# Patient Record
Sex: Female | Born: 1980
Health system: Southern US, Community
[De-identification: ages and names within clinical notes are randomized; demographics above are authoritative.]

## PROBLEM LIST (undated history)

## (undated) DIAGNOSIS — F419 Anxiety disorder, unspecified: Secondary | ICD-10-CM

## (undated) DIAGNOSIS — F32A Depression, unspecified: Secondary | ICD-10-CM

## (undated) DIAGNOSIS — D510 Vitamin B12 deficiency anemia due to intrinsic factor deficiency: Secondary | ICD-10-CM

## (undated) DIAGNOSIS — E785 Hyperlipidemia, unspecified: Secondary | ICD-10-CM

## (undated) HISTORY — DX: Anxiety disorder, unspecified: F41.9

## (undated) HISTORY — DX: Vitamin B12 deficiency anemia due to intrinsic factor deficiency: D51.0

## (undated) HISTORY — PX: KNEE SURGERY: SHX244

## (undated) HISTORY — DX: Depression, unspecified: F32.A

## (undated) HISTORY — DX: Hyperlipidemia, unspecified: E78.5

## (undated) HISTORY — PX: TONSILLECTOMY: SUR1361

## (undated) HISTORY — PX: TYMPANOSTOMY TUBE PLACEMENT: SHX32

## (undated) HISTORY — PX: APPENDECTOMY: SHX54

---

## 2006-10-02 ENCOUNTER — Ambulatory Visit (HOSPITAL_COMMUNITY): Admission: RE | Admit: 2006-10-02 | Discharge: 2006-10-02 | Payer: Self-pay | Admitting: Family Medicine

## 2006-10-02 IMAGING — US US OB TRANSVAGINAL MODIFY
1 series · 14 of 16 positions shown · non-contrast
Comparison: none

OBSTETRICAL ULTRASOUND:

 This ultrasound exam was performed in the [HOSPITAL] Ultrasound Department.  The OB US report was generated in the AS system, and faxed to the ordering physician.  This report is also available in [REDACTED] PACS.

[Series 1: us ob transvaginal modify · 0.30mm/px · 14 of 16 slices shown]
[im 1/16]
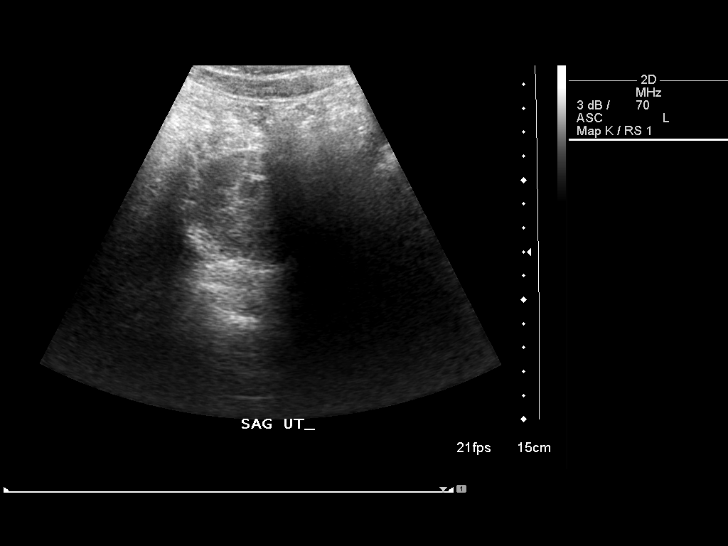
[im 2/16]
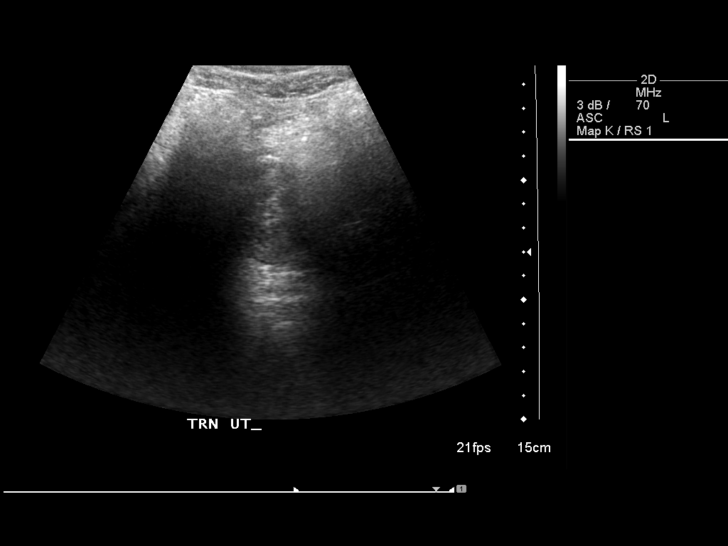
[im 3/16]
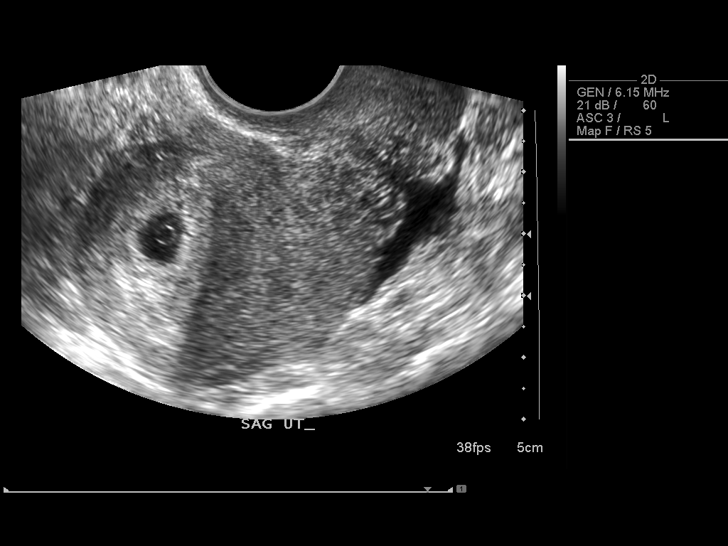
[im 5/16]
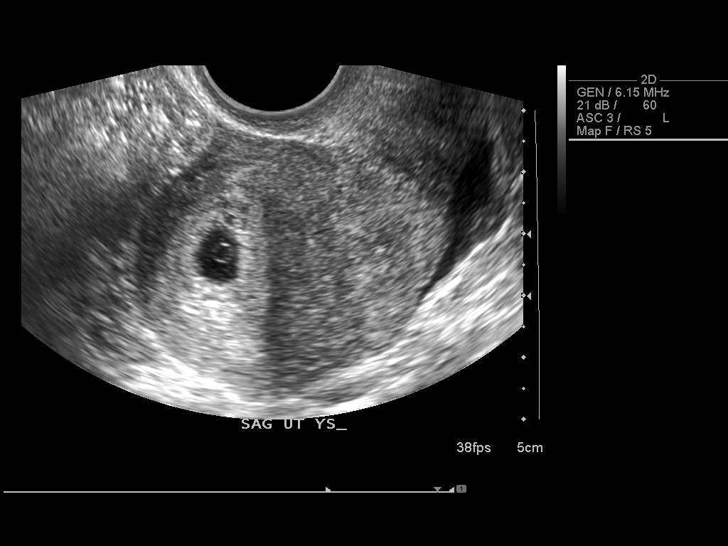
[im 6/16]
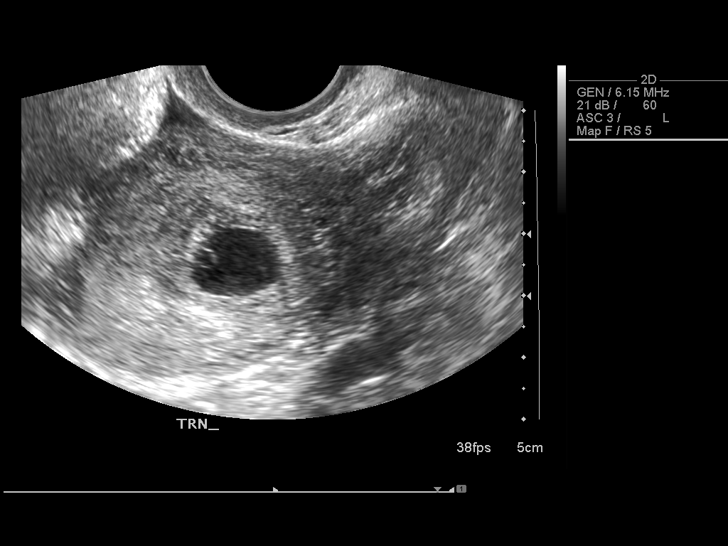
[im 7/16]
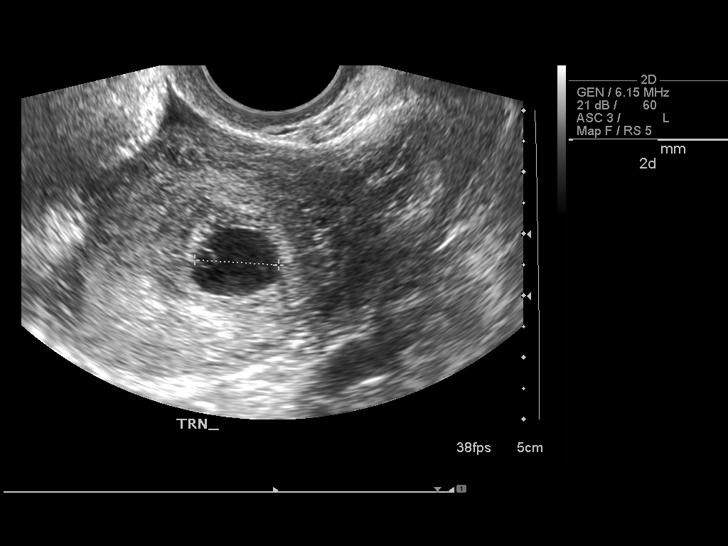
[im 8/16]
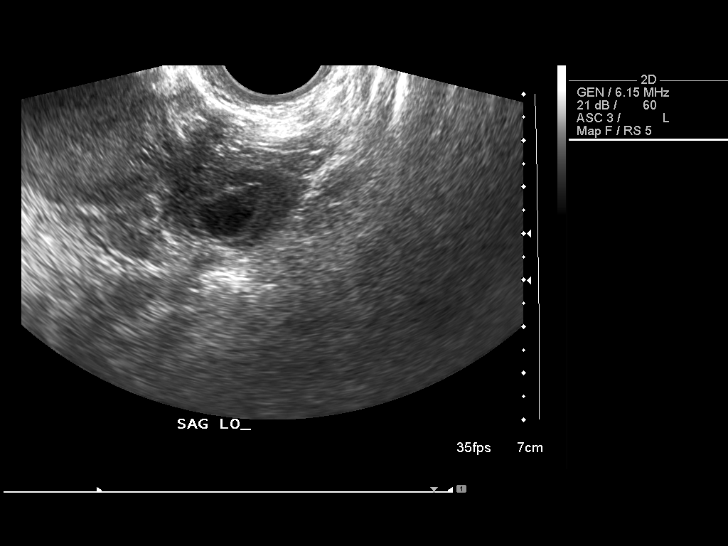
[im 9/16]
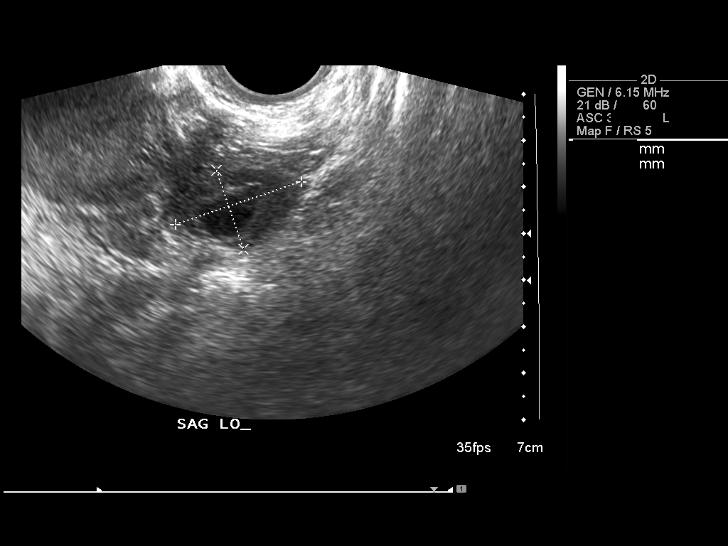
[im 10/16]
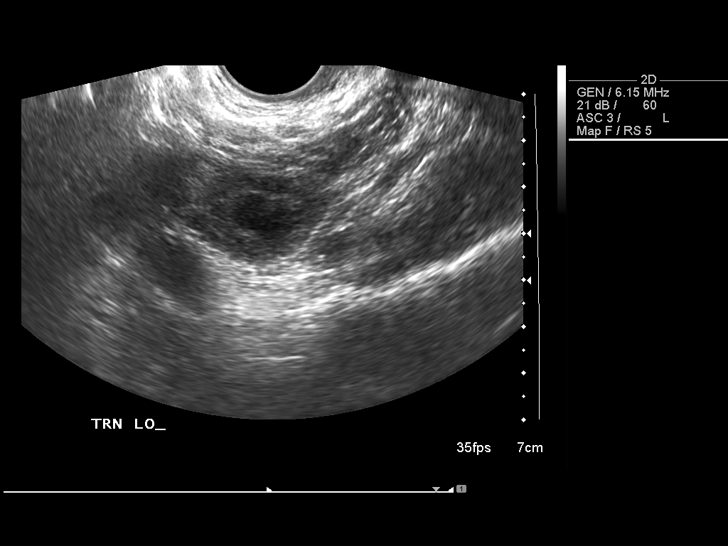
[im 11/16]
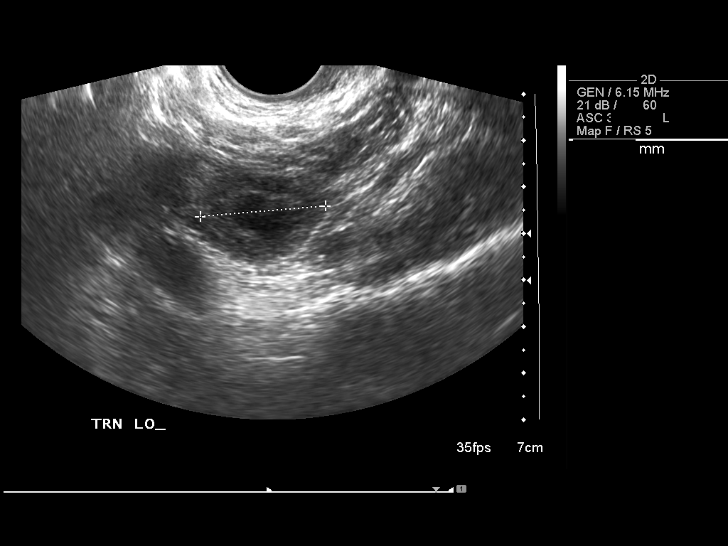
[im 13/16]
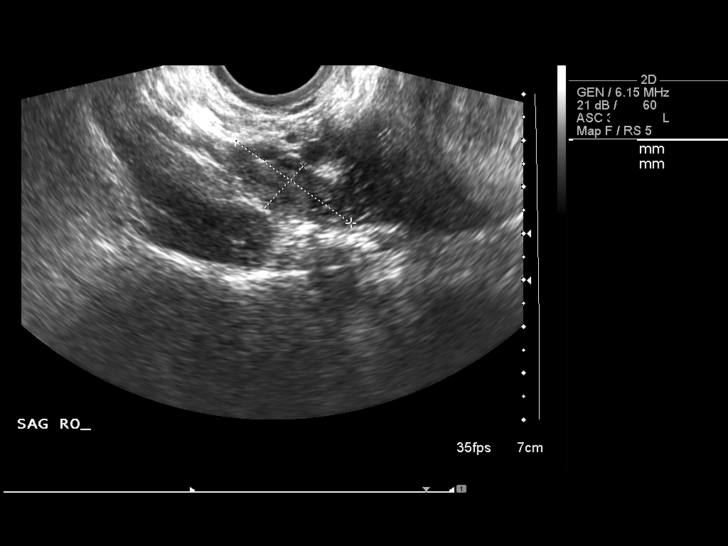
[im 14/16]
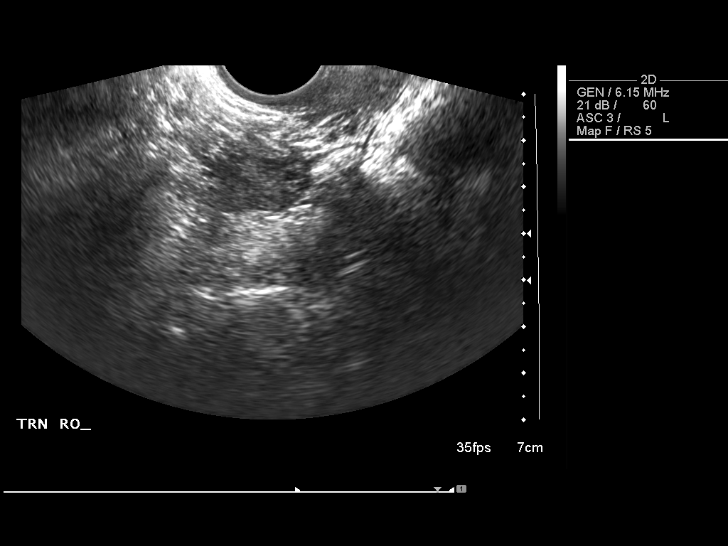
[im 15/16]
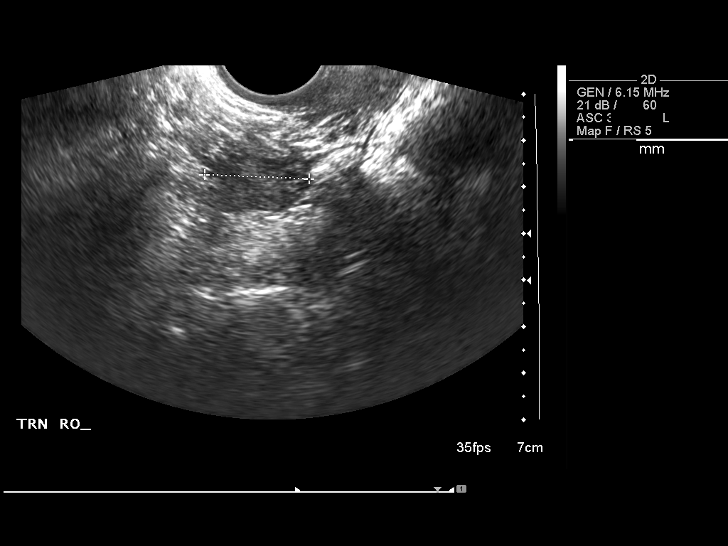
[im 16/16]
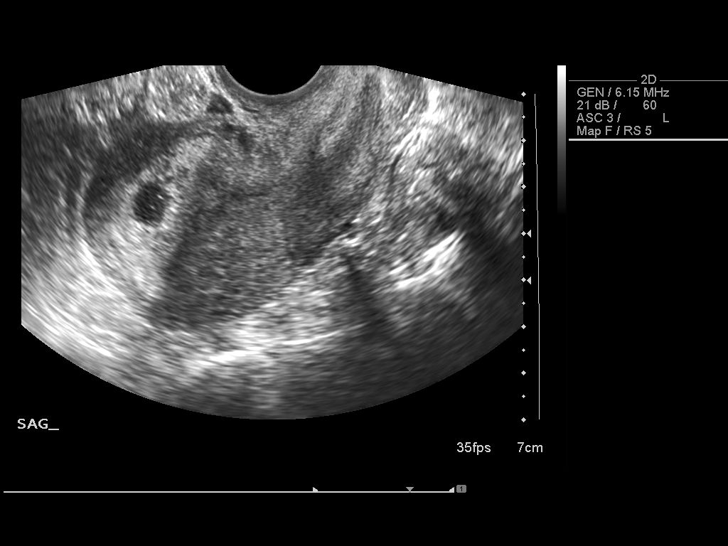

[14 of 16 positions shown; findings below may reference images not displayed]

IMPRESSION: See AS Obstetric US report.

## 2007-03-04 ENCOUNTER — Inpatient Hospital Stay (HOSPITAL_COMMUNITY): Admission: AD | Admit: 2007-03-04 | Discharge: 2007-03-04 | Payer: Self-pay | Admitting: Obstetrics and Gynecology

## 2007-03-04 IMAGING — US US OB UMBILICAL ART DOPPLER
1 series · 14 of 28 positions shown · non-contrast
Comparison: none

OBSTETRICAL ULTRASOUND:

 This ultrasound exam was performed in the [HOSPITAL] Ultrasound Department.  The OB US report was generated in the AS system, and faxed to the ordering physician.  This report is also available in [REDACTED] PACS.

[Series 1: us ob comp +14 wk · 14 of 28 slices shown]
[im 2/28]
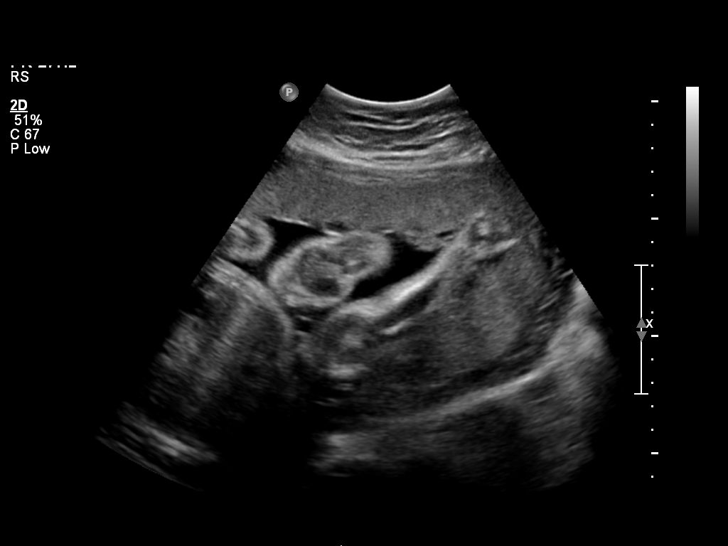
[im 4/28]
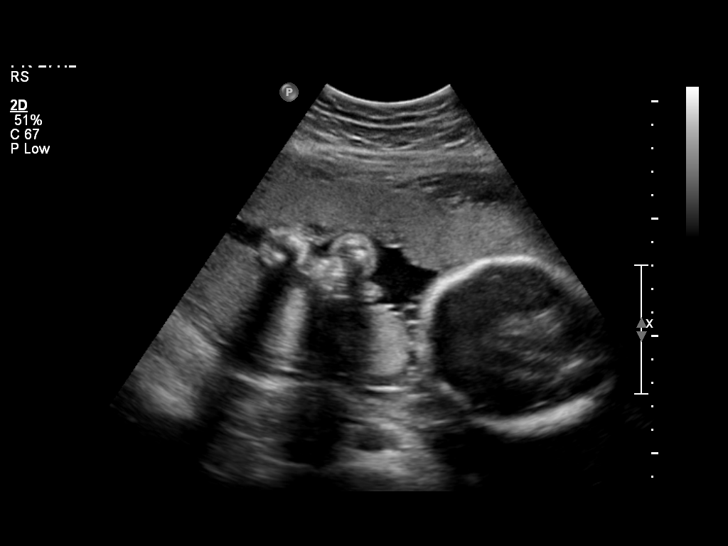
[im 6/28]
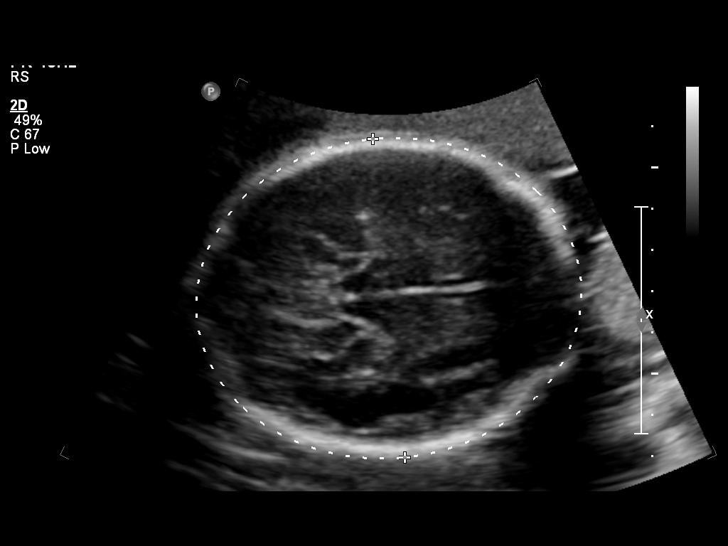
[im 8/28]
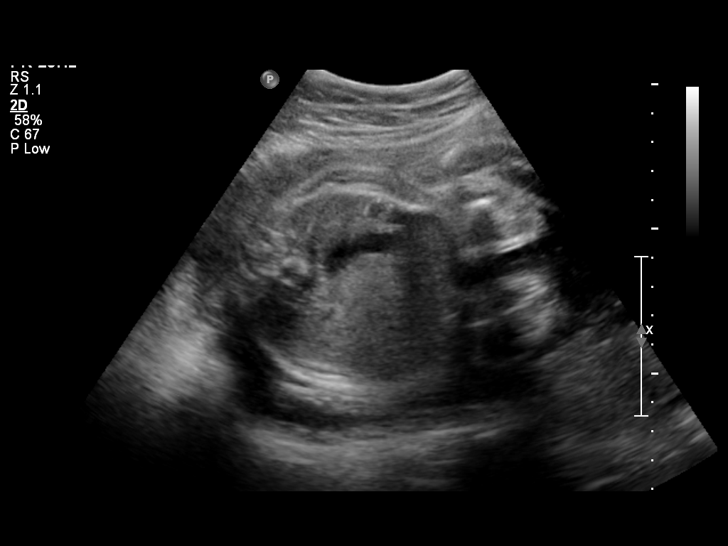
[im 10/28]
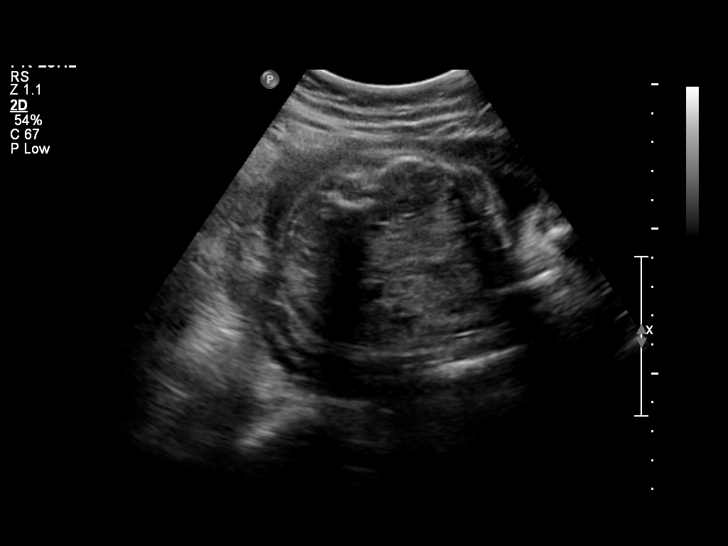
[im 12/28]
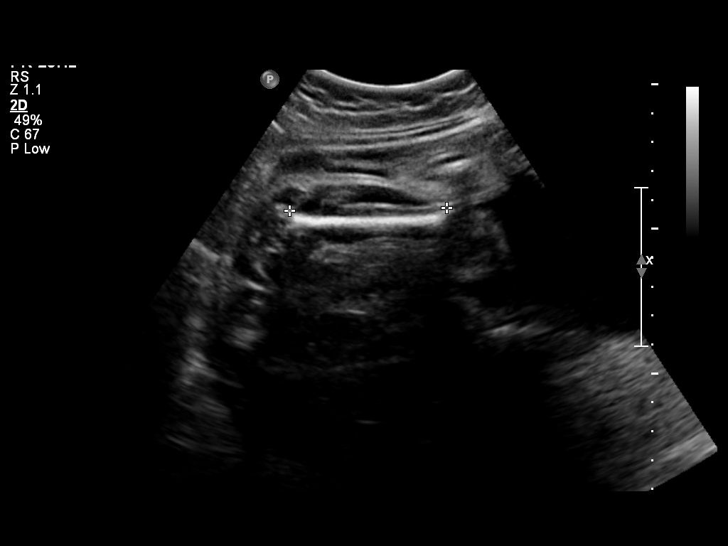
[im 14/28]
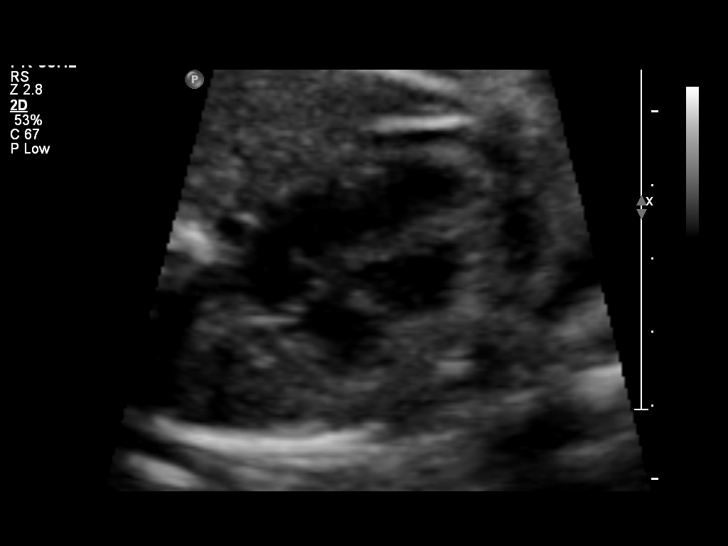
[im 16/28]
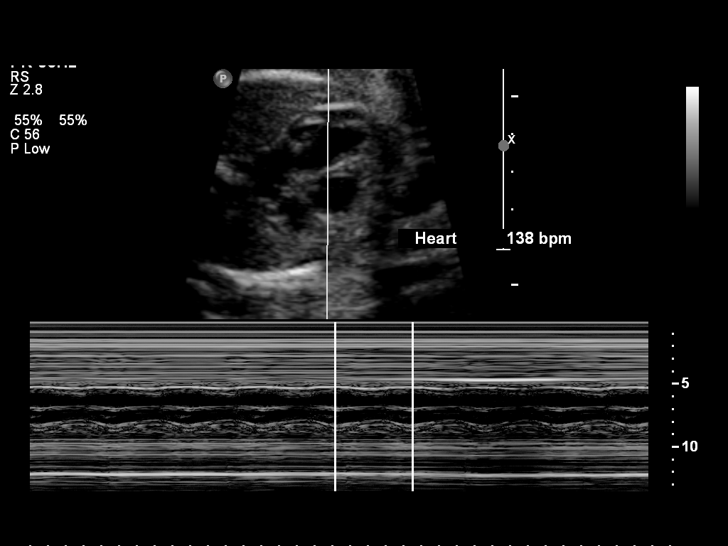
[im 18/28]
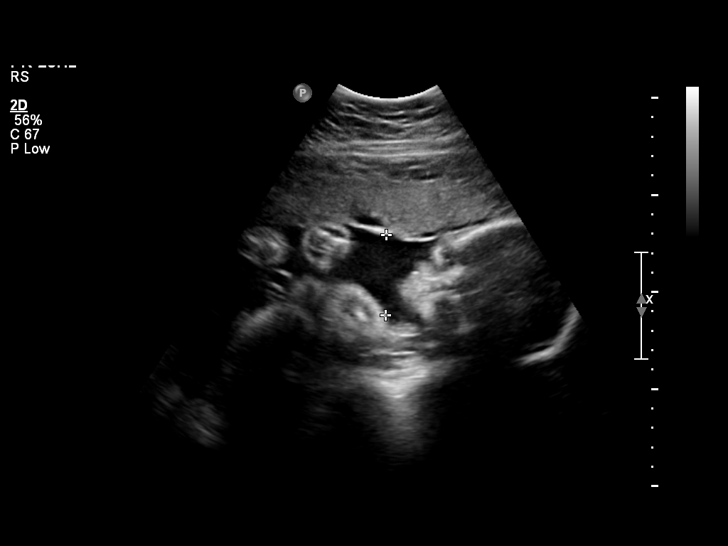
[im 20/28]
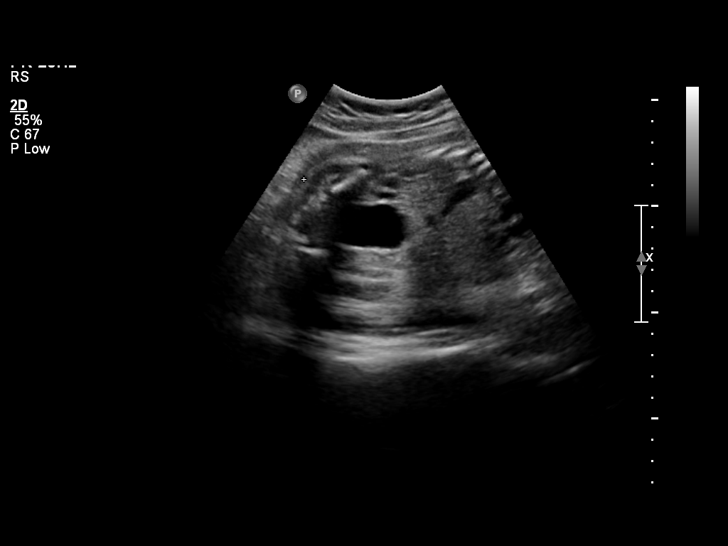
[im 22/28]
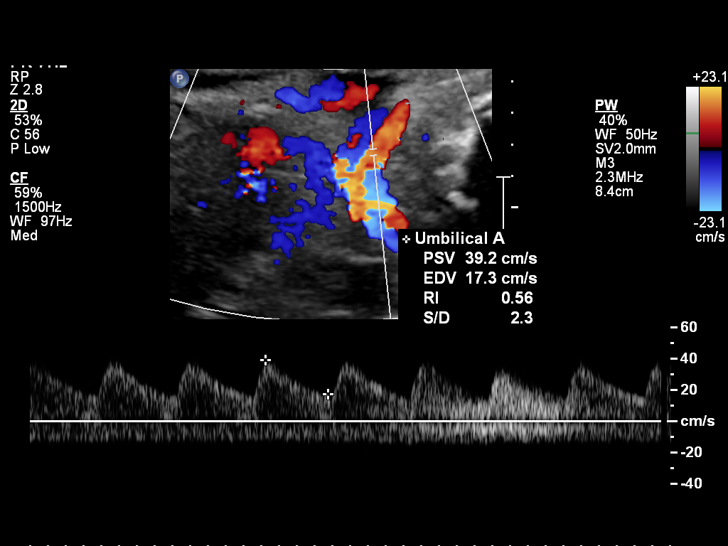
[im 24/28]
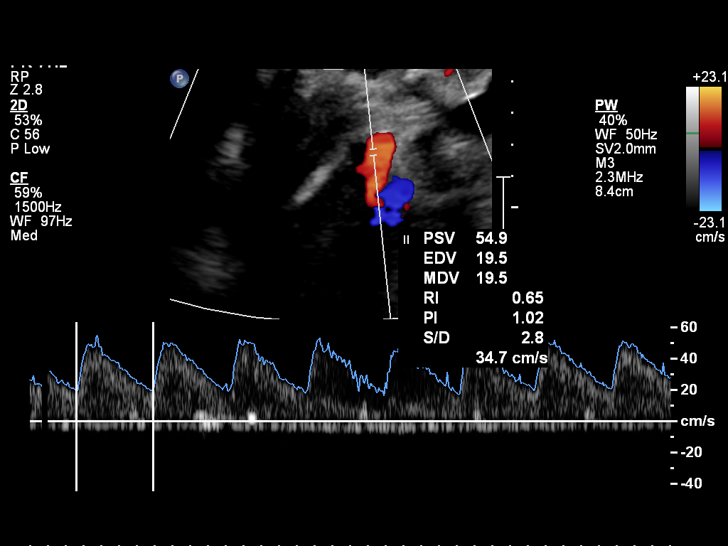
[im 26/28]
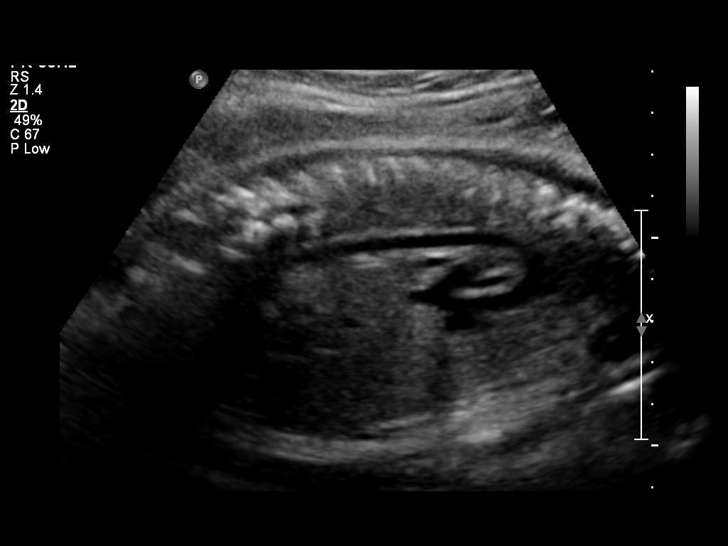
[im 28/28]
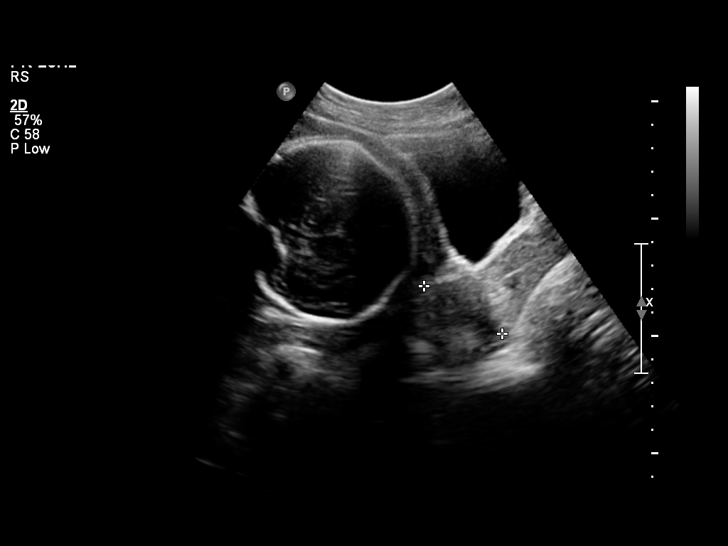

[14 of 28 positions shown; findings below may reference images not displayed]

IMPRESSION: See AS Obstetric US report.

## 2007-05-19 ENCOUNTER — Inpatient Hospital Stay (HOSPITAL_COMMUNITY): Admission: AD | Admit: 2007-05-19 | Discharge: 2007-05-19 | Payer: Self-pay | Admitting: Obstetrics and Gynecology

## 2007-05-24 ENCOUNTER — Inpatient Hospital Stay (HOSPITAL_COMMUNITY): Admission: AD | Admit: 2007-05-24 | Discharge: 2007-05-28 | Payer: Self-pay | Admitting: Obstetrics and Gynecology

## 2007-05-25 ENCOUNTER — Encounter (INDEPENDENT_AMBULATORY_CARE_PROVIDER_SITE_OTHER): Payer: Self-pay | Admitting: Obstetrics and Gynecology

## 2010-07-04 NOTE — Op Note (Signed)
Denise Huff, Denise Huff            ACCOUNT NO.:  [id number]   MEDICAL RECORD NO.:  [id number]          PATIENT TYPE:  INP   LOCATION:  9171                          FACILITY:  [facility]   PHYSICIAN:  Osborn Coho, M.D.   DATE OF BIRTH:  [birth year]   DATE OF PROCEDURE:  05/25/2007  DATE OF DISCHARGE:                               OPERATIVE REPORT   PREOPERATIVE DIAGNOSES:  1. Term intrauterine pregnancy.  2. Failure to descend.   POSTOPERATIVE DIAGNOSES:  1. Term intrauterine pregnancy.  2. Failure to descend.   PROCEDURE:  Primary low transverse cesarean section.   ANESTHESIA:  Spinal (epidural catheter was out).   ASSISTANT:  Denny Levy, CNM.   FLUIDS:  2700 mL.   URINE OUTPUT:  300 mL.   EBL:  800 mL.   COMPLICATIONS:  None.   FINDINGS:  Live female infant with Apgars of 9 at one minute and 9 at  five minutes, Laruth Bouchard, weighing 7 pounds 9 ounces.  Normal  appearing bilateral ovaries and fallopian tubes.   PROCEDURE:  The patient was taken to the operating room after the risks,  benefits and alternatives reviewed with patient.  The patient verbalized  understanding and consent signed and witnessed.  The patient was given a  spinal for anesthesia and prepped and draped in the normal sterile  fashion.  A Pfannenstiel skin incision was made and carried down to the  underlying layer fascia with the scalpel and Bovie.  The fascia was  excised bilaterally in the midline and extended bilaterally with the  Mayo scissors.  Kocher clamps were placed on the inferior aspect of the  fascial incision and the rectus muscle excised from the fascia.  This  time was done on the superior aspect of the fascial incision.  The  muscle was separated in the midline and the peritoneum entered bluntly  and extended manually.  A bladder blade was placed and bilateral flap  created with the Metzenbaum scissors.  A uterine incision was made with  the scalpel, extended bilaterally with the  bandage scissors.  Infant was  delivered in the right occiput transverse presentation and there was a  body and partial nuchal cord noted.  The oropharynx and nasopharynx were  bulb suctioned, the cord was clamped and cut and the infant handed to  the waiting pediatricians.  The placenta was removed via fundal massage  and uterus cleared of all clots and debris.  The uterine incision was  repaired with #0 Vicryl in a running locked fashion and a second  imbricating layer was performed.  The intraabdominal cavity was  copiously irrigated and normal appearing bilateral ovaries and fallopian  tubes as noted above.  The uterine incision was noted to be hemostatic  and the peritoneum was repaired with #2-0 Chromic in a running fashion.  The fascia was repaired with #0 Vicryl in a running fashion.  The  subcutaneous tissue was irrigated and made hemostatic with the Bovie and  the subcutaneous tissue was reapproximated using three interrupted  stitches of #2-0 plain.  The skin was reapproximated using #3-0 Monocryl  via a subcuticular  stitch.  Sponge, lap and needle count was correct.  The incision was dressed with 0.5-inch Steri-Strips with Benzoin and  dressing applied.  Sponge, lap and needle count was correct.  The  patient tolerated the procedure well and was awaiting transfer to the  Recovery Room in good condition.      Osborn Coho, M.D.  Electronically Signed     AR/MEDQ  D:  05/25/2007  T:  05/25/2007  Job:  [id number]

## 2010-07-04 NOTE — H&P (Signed)
NAMEPENNE, ROSENSTOCK NO.:  [id number]   MEDICAL RECORD NO.:  [id number]          PATIENT TYPE:  INP   LOCATION:  9171                          FACILITY:  [facility]   PHYSICIAN:  Osborn Coho, M.D.   DATE OF BIRTH:  August 22, 1980   DATE OF ADMISSION:  05/24/2007  DATE OF DISCHARGE:                              HISTORY & PHYSICAL   HISTORY OF PRESENT ILLNESS:  Ms. Steger is a 30 year old married white  female, primigravida, at 39-2/7 weeks, who presents with spontaneous  rupture of membranes at 4:45 p.m. with onset of mild contractions since  then.  She denies bleeding.  No signs or symptoms of PIH.  She reports  positive fetal movement.  Her pregnancy has been followed by the [facility] Certified Nurse Midwife service and has been remarkable  for:  1.  First trimester spotting; 2.  Migraines; 3.  Group B Strep  negative.   LABORATORY DATA:  Her prenatal labs were collected on October 30, 2006.  Hemoglobin 12.8, hematocrit 38.2, platelets 262,000, blood type O  positive, antibody negative.  Toxoplasmosis labs negative.  RPR  nonreactive.  Rubella immune.  Hepatitis B surface antigen negative.  HIV nonreactive.  Gonorrhea negative, Chlamydia negative.  Cystic  fibrosis negative.  One hour Glucola from February 24, 2007 was 120.  Culture of the vaginal tract for group B Strep in the third trimester  was negative.   HISTORY OF PRESENT PREGNANCY:  The patient presented for care at [facility] on October 30, 2006 at 9-5/7 weeks' gestation.  Showed normal  Pap smear from July 2008.  Anatomy ultrasound at 17-5/7 weeks' gestation  shows growth consistent with previous dating confirming [facility] of May 29, 2007.  She had a normal one-hour Glucola.  She was seen for left upper  quadrant pain at 27 weeks' gestation.  There were some contractions  noted at that visit, so she was given terbutaline.  She had a negative  fetal fibronectin on March 05, 2007 at 28  weeks' gestation.  She was  having some sporadic contractions during the third trimester for which  she used terbutaline as needed.  Fetal fibronectin was negative again at  30 weeks' gestation.  Cervix remained closed.  The rest of her prenatal  care has been unremarkable.   OB HISTORY:  She is primigravida.   PAST MEDICAL HISTORY:  She experienced menarche at the age of 69 with  monthly cycles lasting 5-7 days.  She has used Ortho Tri-Cyclen low  patch and Alesse in the past for contraception.  She has occasional  yeast infection and bacterial infection.  She reports having had the  usual childhood illnesses.  She has had one episode of cystitis.  She  has a history of migraines with menses.   ALLERGIES:  She has no medication allergies.   PAST SURGICAL HISTORY:  1. Remarkable for wisdom teeth extraction.\  2. Tubes in her ears at the age of 5.  3. Skin tag removal.   FAMILY HISTORY:  Maternal grandmother with MI, maternal aunt x2 with MI  in their  early 30s.  One maternal aunt with triple bypass.  The patient  also reports hypercholesterolemia herself but does not take medications.  Her father also has the same.  Paternal grandfather with chronic  hypertension as well as emphysema.  Maternal grandfather with type 2  diabetes.  Father with hepatitis C for which the patient has tested  negative.  Both parents with skin cancer.  Mother with depression and  bipolar.  Maternal grandmother with depression.   GENETIC HISTORY:  Negative.   SOCIAL HISTORY:  The patient is married.  The father of the baby is  involved and supportive.  His name is Onalee Hua.  They are of the Catholic  faith.  The patient has her Bachelor's degree and she is a full time  Runner, broadcasting/film/video.  Father of the baby works full time for [employer].  They deny  any alcohol, tobacco, or illicit drug use with the pregnancy.   OBJECTIVE:  VITAL SIGNS:  Stable.  She is afebrile.  HEENT:  Grossly within normal limits.  CHEST:   Clear to auscultation.  HEART:  Regular rate and rhythm.  ABDOMEN:  Gravid in contour with the fundal height extending  approximately 39 cm above the pubic symphysis.  Fetal heart rate is  reactive and reassuring.  Contractions are every 2-4 minutes and mild.  CERVIX:  Cervix is 1-2 cm, 80%, vertex, -2, which is essentially the  same as her last exam.  There is copious clear fluid present on the  perineum.  EXTREMITIES:  Normal.   ASSESSMENT:  1. Intrauterine pregnancy at term.  2. Spontaneous rupture of membranes.  3. Early labor.   PLAN:  1. Admit to birthing suites.  2. Routine CNM orders.  3. Plan for ambulation and being up in the room for now.      Cam Hai, C.N.M.      Osborn Coho, M.D.  Electronically Signed    KS/MEDQ  D:  05/24/2007  T:  05/25/2007  Job:  [id number]

## 2010-07-04 NOTE — Discharge Summary (Signed)
Denise, Huff            ACCOUNT NO.:  [id number]   MEDICAL RECORD NO.:  [id number]          PATIENT TYPE:  INP   LOCATION:  9103                          FACILITY:  [facility]   PHYSICIAN:  Osborn Coho, M.D.   DATE OF BIRTH:  Jan 13, 1981   DATE OF ADMISSION:  05/24/2007  DATE OF DISCHARGE:  05/28/2007                               DISCHARGE SUMMARY   DISCHARGING PHYSICIAN:  Jaymes Graff, MD   ADMISSION DIAGNOSIS:  1. Intrauterine pregnancy at term.  2. Spontaneous rupture of membranes.  3. Early labor.   DISCHARGE DIAGNOSES:  1. Intrauterine pregnancy at term.  2. Spontaneous rupture of membranes.  3. Early labor.  4. Failure to descend and status post cesarean delivery of a viable      female infant, Apgars 9 and 9, weighing 7 pounds 9 ounces.   HOSPITAL PROCEDURES:  1. Electronic fetal monitoring.  2. Epidural anesthesia.  3. Spinal anesthesia.  4. Primary low transverse cesarean section.   HOSPITAL COURSE:  The patient was admitted with ruptured membranes and  mild contractions.  Cervix on admission was 1-2 cm and 80% effaced.  She  proceeded expectantly with ambulation and progressed to 4 cm through the  night and then to 8 cm in the middle of the night.  She got to complete  dilation at 5 a.m. on May 25, 2007, and labored down for 2 hours and  then began pushing.  She pushed for almost 3 hours and the vertex did  not descend beyond +2 station.  The patient exhibited frustration and  fatigue, and decision was made to proceed with cesarean section for  delivery.  Dr. Su Hilt performed this under spinal anesthesia as the  epidural catheter had come out.  Primary low transverse cesarean section  was done for female infant, Apgars of 9 and 9, weighing 7 pounds 9  ounces.  EBL was 800 mL.  She was taken to recovery and then mother-baby  unit where she did well.  On postop day #1, Foley was taken out, IV was  taken out, and the patient began taking regular diet.   Hemoglobin was  10.3.  The patient received routine care.  She continued to receive care  on postop day #2 and increased ambulation and was doing well breast  feeding.  On postop day #3, she was ready to go home.  Vital signs were  stable.  She was breast-feeding well and her milk was coming in.  Chest  was clear.  Heart has regular rate and rhythm.  Abdomen was soft and  appropriately tender.  Incision was clean, dry, and intact.  Lochia was  normal.  Extremities were normal.  She was deemed to receive full  benefit of her hospital stay and was discharged home   DISCHARGE MEDICATIONS:  1. Motrin 600 mg p.o. q.6 h. p.r.n.  2. Tylox 1-2 p.o. q.4 h. p.r.n.   DISCHARGE LABS:  White blood cell count 11.2, hemoglobin 10.3, platelets  213.   DISCHARGE INSTRUCTIONS:  Per [facility] handout.  Discharge followup in 6  weeks or p.r.n.  Marie L. Williams, C.N.M.      Osborn Coho, M.D.  Electronically Signed    MLW/MEDQ  D:  05/28/2007  T:  05/29/2007  Job:  [id number]

## 2010-10-20 ENCOUNTER — Telehealth: Payer: Self-pay | Admitting: Family Medicine

## 2010-10-20 MED ORDER — ONDANSETRON HCL 4 MG PO TABS: 4.0000 mg | ORAL_TABLET | Freq: Three times a day (TID) | ORAL | Status: AC | PRN

## 2010-10-20 NOTE — Telephone Encounter (Signed)
Here with son today for his appt. She is vomiting and it started about 2 hours. No fever. Likely gastroenteritis.

## 2010-11-01 DIAGNOSIS — Z9049 Acquired absence of other specified parts of digestive tract: Secondary | ICD-10-CM | POA: Insufficient documentation

## 2010-11-14 LAB — CBC
HCT: 29.7 — ABNORMAL LOW
HCT: 42.3
Hemoglobin: 14.6
MCHC: 34.6
MCV: 95.6
MCV: 96.4
Platelets: 213
RBC: 4.43
RDW: 13.2

## 2013-12-03 DIAGNOSIS — J312 Chronic pharyngitis: Secondary | ICD-10-CM | POA: Insufficient documentation

## 2015-08-05 LAB — HM PAP SMEAR: HM Pap smear: NEGATIVE

## 2016-06-25 DIAGNOSIS — M79605 Pain in left leg: Secondary | ICD-10-CM | POA: Insufficient documentation

## 2016-10-24 DIAGNOSIS — M25562 Pain in left knee: Secondary | ICD-10-CM | POA: Diagnosis not present

## 2016-10-24 DIAGNOSIS — M6281 Muscle weakness (generalized): Secondary | ICD-10-CM | POA: Diagnosis not present

## 2016-10-24 DIAGNOSIS — R2689 Other abnormalities of gait and mobility: Secondary | ICD-10-CM | POA: Diagnosis not present

## 2016-10-31 DIAGNOSIS — R2689 Other abnormalities of gait and mobility: Secondary | ICD-10-CM | POA: Diagnosis not present

## 2016-10-31 DIAGNOSIS — M6281 Muscle weakness (generalized): Secondary | ICD-10-CM | POA: Diagnosis not present

## 2016-10-31 DIAGNOSIS — M25562 Pain in left knee: Secondary | ICD-10-CM | POA: Diagnosis not present

## 2016-11-05 DIAGNOSIS — R2689 Other abnormalities of gait and mobility: Secondary | ICD-10-CM | POA: Diagnosis not present

## 2016-11-05 DIAGNOSIS — M25562 Pain in left knee: Secondary | ICD-10-CM | POA: Diagnosis not present

## 2016-11-05 DIAGNOSIS — M6281 Muscle weakness (generalized): Secondary | ICD-10-CM | POA: Diagnosis not present

## 2016-11-12 DIAGNOSIS — R2689 Other abnormalities of gait and mobility: Secondary | ICD-10-CM | POA: Diagnosis not present

## 2016-11-12 DIAGNOSIS — M25562 Pain in left knee: Secondary | ICD-10-CM | POA: Diagnosis not present

## 2016-11-12 DIAGNOSIS — M6281 Muscle weakness (generalized): Secondary | ICD-10-CM | POA: Diagnosis not present

## 2016-11-19 DIAGNOSIS — M25562 Pain in left knee: Secondary | ICD-10-CM | POA: Diagnosis not present

## 2016-11-19 DIAGNOSIS — M6281 Muscle weakness (generalized): Secondary | ICD-10-CM | POA: Diagnosis not present

## 2016-11-19 DIAGNOSIS — R2689 Other abnormalities of gait and mobility: Secondary | ICD-10-CM | POA: Diagnosis not present

## 2016-11-21 DIAGNOSIS — H6983 Other specified disorders of Eustachian tube, bilateral: Secondary | ICD-10-CM | POA: Diagnosis not present

## 2016-11-21 DIAGNOSIS — T161XXA Foreign body in right ear, initial encounter: Secondary | ICD-10-CM | POA: Diagnosis not present

## 2016-11-26 DIAGNOSIS — R2689 Other abnormalities of gait and mobility: Secondary | ICD-10-CM | POA: Diagnosis not present

## 2016-11-26 DIAGNOSIS — M6281 Muscle weakness (generalized): Secondary | ICD-10-CM | POA: Diagnosis not present

## 2016-11-26 DIAGNOSIS — M25562 Pain in left knee: Secondary | ICD-10-CM | POA: Diagnosis not present

## 2016-11-27 DIAGNOSIS — M62552 Muscle wasting and atrophy, not elsewhere classified, left thigh: Secondary | ICD-10-CM | POA: Insufficient documentation

## 2016-11-27 DIAGNOSIS — M25862 Other specified joint disorders, left knee: Secondary | ICD-10-CM | POA: Diagnosis not present

## 2016-11-27 DIAGNOSIS — I872 Venous insufficiency (chronic) (peripheral): Secondary | ICD-10-CM | POA: Insufficient documentation

## 2016-11-27 DIAGNOSIS — D510 Vitamin B12 deficiency anemia due to intrinsic factor deficiency: Secondary | ICD-10-CM | POA: Insufficient documentation

## 2016-11-27 DIAGNOSIS — M25562 Pain in left knee: Secondary | ICD-10-CM | POA: Diagnosis not present

## 2016-11-27 DIAGNOSIS — Z9889 Other specified postprocedural states: Secondary | ICD-10-CM | POA: Diagnosis not present

## 2016-12-02 DIAGNOSIS — M25862 Other specified joint disorders, left knee: Secondary | ICD-10-CM | POA: Diagnosis not present

## 2016-12-06 DIAGNOSIS — M7989 Other specified soft tissue disorders: Secondary | ICD-10-CM | POA: Diagnosis not present

## 2016-12-06 DIAGNOSIS — I998 Other disorder of circulatory system: Secondary | ICD-10-CM | POA: Diagnosis not present

## 2016-12-10 DIAGNOSIS — M25562 Pain in left knee: Secondary | ICD-10-CM | POA: Diagnosis not present

## 2016-12-10 DIAGNOSIS — R2689 Other abnormalities of gait and mobility: Secondary | ICD-10-CM | POA: Diagnosis not present

## 2016-12-10 DIAGNOSIS — M6281 Muscle weakness (generalized): Secondary | ICD-10-CM | POA: Diagnosis not present

## 2016-12-17 DIAGNOSIS — R2689 Other abnormalities of gait and mobility: Secondary | ICD-10-CM | POA: Diagnosis not present

## 2016-12-17 DIAGNOSIS — M25562 Pain in left knee: Secondary | ICD-10-CM | POA: Diagnosis not present

## 2016-12-17 DIAGNOSIS — M6281 Muscle weakness (generalized): Secondary | ICD-10-CM | POA: Diagnosis not present

## 2016-12-26 DIAGNOSIS — M25562 Pain in left knee: Secondary | ICD-10-CM | POA: Diagnosis not present

## 2016-12-26 DIAGNOSIS — M6281 Muscle weakness (generalized): Secondary | ICD-10-CM | POA: Diagnosis not present

## 2016-12-26 DIAGNOSIS — R2689 Other abnormalities of gait and mobility: Secondary | ICD-10-CM | POA: Diagnosis not present

## 2016-12-31 DIAGNOSIS — M25562 Pain in left knee: Secondary | ICD-10-CM | POA: Diagnosis not present

## 2016-12-31 DIAGNOSIS — R2689 Other abnormalities of gait and mobility: Secondary | ICD-10-CM | POA: Diagnosis not present

## 2017-01-02 DIAGNOSIS — Z9889 Other specified postprocedural states: Secondary | ICD-10-CM | POA: Diagnosis not present

## 2017-01-02 DIAGNOSIS — I872 Venous insufficiency (chronic) (peripheral): Secondary | ICD-10-CM | POA: Diagnosis not present

## 2017-01-02 DIAGNOSIS — M25862 Other specified joint disorders, left knee: Secondary | ICD-10-CM | POA: Diagnosis not present

## 2017-01-02 DIAGNOSIS — M25562 Pain in left knee: Secondary | ICD-10-CM | POA: Diagnosis not present

## 2017-01-02 DIAGNOSIS — M62552 Muscle wasting and atrophy, not elsewhere classified, left thigh: Secondary | ICD-10-CM | POA: Diagnosis not present

## 2017-01-02 DIAGNOSIS — M79605 Pain in left leg: Secondary | ICD-10-CM | POA: Diagnosis not present

## 2017-01-21 DIAGNOSIS — N390 Urinary tract infection, site not specified: Secondary | ICD-10-CM | POA: Diagnosis not present

## 2017-01-21 DIAGNOSIS — B373 Candidiasis of vulva and vagina: Secondary | ICD-10-CM | POA: Diagnosis not present

## 2017-01-21 DIAGNOSIS — J069 Acute upper respiratory infection, unspecified: Secondary | ICD-10-CM | POA: Diagnosis not present

## 2017-02-01 DIAGNOSIS — M79605 Pain in left leg: Secondary | ICD-10-CM | POA: Diagnosis not present

## 2017-02-01 DIAGNOSIS — M25562 Pain in left knee: Secondary | ICD-10-CM | POA: Diagnosis not present

## 2017-02-04 DIAGNOSIS — M25862 Other specified joint disorders, left knee: Secondary | ICD-10-CM | POA: Diagnosis not present

## 2017-02-04 DIAGNOSIS — I872 Venous insufficiency (chronic) (peripheral): Secondary | ICD-10-CM | POA: Diagnosis not present

## 2017-02-04 DIAGNOSIS — M222X2 Patellofemoral disorders, left knee: Secondary | ICD-10-CM | POA: Diagnosis not present

## 2017-02-04 DIAGNOSIS — G8918 Other acute postprocedural pain: Secondary | ICD-10-CM | POA: Diagnosis not present

## 2017-02-04 DIAGNOSIS — M659 Synovitis and tenosynovitis, unspecified: Secondary | ICD-10-CM | POA: Diagnosis not present

## 2017-02-07 DIAGNOSIS — M25662 Stiffness of left knee, not elsewhere classified: Secondary | ICD-10-CM | POA: Diagnosis not present

## 2017-02-07 DIAGNOSIS — M6281 Muscle weakness (generalized): Secondary | ICD-10-CM | POA: Diagnosis not present

## 2017-02-07 DIAGNOSIS — M25562 Pain in left knee: Secondary | ICD-10-CM | POA: Diagnosis not present

## 2017-02-07 DIAGNOSIS — R262 Difficulty in walking, not elsewhere classified: Secondary | ICD-10-CM | POA: Diagnosis not present

## 2017-02-14 DIAGNOSIS — R262 Difficulty in walking, not elsewhere classified: Secondary | ICD-10-CM | POA: Diagnosis not present

## 2017-02-14 DIAGNOSIS — M25662 Stiffness of left knee, not elsewhere classified: Secondary | ICD-10-CM | POA: Diagnosis not present

## 2017-02-14 DIAGNOSIS — M25562 Pain in left knee: Secondary | ICD-10-CM | POA: Diagnosis not present

## 2017-02-14 DIAGNOSIS — M6281 Muscle weakness (generalized): Secondary | ICD-10-CM | POA: Diagnosis not present

## 2017-02-20 DIAGNOSIS — M6281 Muscle weakness (generalized): Secondary | ICD-10-CM | POA: Diagnosis not present

## 2017-02-20 DIAGNOSIS — M25862 Other specified joint disorders, left knee: Secondary | ICD-10-CM | POA: Diagnosis not present

## 2017-02-20 DIAGNOSIS — R262 Difficulty in walking, not elsewhere classified: Secondary | ICD-10-CM | POA: Diagnosis not present

## 2017-02-20 DIAGNOSIS — M25562 Pain in left knee: Secondary | ICD-10-CM | POA: Diagnosis not present

## 2017-02-20 DIAGNOSIS — M25662 Stiffness of left knee, not elsewhere classified: Secondary | ICD-10-CM | POA: Diagnosis not present

## 2017-02-26 DIAGNOSIS — M25562 Pain in left knee: Secondary | ICD-10-CM | POA: Diagnosis not present

## 2017-02-26 DIAGNOSIS — M25662 Stiffness of left knee, not elsewhere classified: Secondary | ICD-10-CM | POA: Diagnosis not present

## 2017-02-26 DIAGNOSIS — M6281 Muscle weakness (generalized): Secondary | ICD-10-CM | POA: Diagnosis not present

## 2017-02-26 DIAGNOSIS — R262 Difficulty in walking, not elsewhere classified: Secondary | ICD-10-CM | POA: Diagnosis not present

## 2017-02-28 DIAGNOSIS — M25562 Pain in left knee: Secondary | ICD-10-CM | POA: Diagnosis not present

## 2017-02-28 DIAGNOSIS — R262 Difficulty in walking, not elsewhere classified: Secondary | ICD-10-CM | POA: Diagnosis not present

## 2017-02-28 DIAGNOSIS — M25662 Stiffness of left knee, not elsewhere classified: Secondary | ICD-10-CM | POA: Diagnosis not present

## 2017-02-28 DIAGNOSIS — R2689 Other abnormalities of gait and mobility: Secondary | ICD-10-CM | POA: Diagnosis not present

## 2017-03-04 DIAGNOSIS — M25662 Stiffness of left knee, not elsewhere classified: Secondary | ICD-10-CM | POA: Diagnosis not present

## 2017-03-04 DIAGNOSIS — R262 Difficulty in walking, not elsewhere classified: Secondary | ICD-10-CM | POA: Diagnosis not present

## 2017-03-04 DIAGNOSIS — M25562 Pain in left knee: Secondary | ICD-10-CM | POA: Diagnosis not present

## 2017-03-04 DIAGNOSIS — M6281 Muscle weakness (generalized): Secondary | ICD-10-CM | POA: Diagnosis not present

## 2017-03-06 DIAGNOSIS — R262 Difficulty in walking, not elsewhere classified: Secondary | ICD-10-CM | POA: Diagnosis not present

## 2017-03-06 DIAGNOSIS — R2689 Other abnormalities of gait and mobility: Secondary | ICD-10-CM | POA: Diagnosis not present

## 2017-03-06 DIAGNOSIS — M25662 Stiffness of left knee, not elsewhere classified: Secondary | ICD-10-CM | POA: Diagnosis not present

## 2017-03-06 DIAGNOSIS — M25562 Pain in left knee: Secondary | ICD-10-CM | POA: Diagnosis not present

## 2017-03-11 DIAGNOSIS — M6281 Muscle weakness (generalized): Secondary | ICD-10-CM | POA: Diagnosis not present

## 2017-03-11 DIAGNOSIS — R262 Difficulty in walking, not elsewhere classified: Secondary | ICD-10-CM | POA: Diagnosis not present

## 2017-03-11 DIAGNOSIS — M25662 Stiffness of left knee, not elsewhere classified: Secondary | ICD-10-CM | POA: Diagnosis not present

## 2017-03-11 DIAGNOSIS — M25562 Pain in left knee: Secondary | ICD-10-CM | POA: Diagnosis not present

## 2017-03-13 DIAGNOSIS — M6281 Muscle weakness (generalized): Secondary | ICD-10-CM | POA: Diagnosis not present

## 2017-03-13 DIAGNOSIS — M25662 Stiffness of left knee, not elsewhere classified: Secondary | ICD-10-CM | POA: Diagnosis not present

## 2017-03-13 DIAGNOSIS — R262 Difficulty in walking, not elsewhere classified: Secondary | ICD-10-CM | POA: Diagnosis not present

## 2017-03-13 DIAGNOSIS — M25562 Pain in left knee: Secondary | ICD-10-CM | POA: Diagnosis not present

## 2017-03-19 DIAGNOSIS — L723 Sebaceous cyst: Secondary | ICD-10-CM | POA: Diagnosis not present

## 2017-03-20 DIAGNOSIS — M25562 Pain in left knee: Secondary | ICD-10-CM | POA: Diagnosis not present

## 2017-03-20 DIAGNOSIS — M25662 Stiffness of left knee, not elsewhere classified: Secondary | ICD-10-CM | POA: Diagnosis not present

## 2017-03-20 DIAGNOSIS — M6281 Muscle weakness (generalized): Secondary | ICD-10-CM | POA: Diagnosis not present

## 2017-03-20 DIAGNOSIS — R262 Difficulty in walking, not elsewhere classified: Secondary | ICD-10-CM | POA: Diagnosis not present

## 2017-03-25 DIAGNOSIS — M6281 Muscle weakness (generalized): Secondary | ICD-10-CM | POA: Diagnosis not present

## 2017-03-25 DIAGNOSIS — M25662 Stiffness of left knee, not elsewhere classified: Secondary | ICD-10-CM | POA: Diagnosis not present

## 2017-03-25 DIAGNOSIS — M25562 Pain in left knee: Secondary | ICD-10-CM | POA: Diagnosis not present

## 2017-03-25 DIAGNOSIS — R262 Difficulty in walking, not elsewhere classified: Secondary | ICD-10-CM | POA: Diagnosis not present

## 2017-04-01 DIAGNOSIS — L723 Sebaceous cyst: Secondary | ICD-10-CM | POA: Diagnosis not present

## 2017-04-02 DIAGNOSIS — R262 Difficulty in walking, not elsewhere classified: Secondary | ICD-10-CM | POA: Diagnosis not present

## 2017-04-02 DIAGNOSIS — M25562 Pain in left knee: Secondary | ICD-10-CM | POA: Diagnosis not present

## 2017-04-02 DIAGNOSIS — M6281 Muscle weakness (generalized): Secondary | ICD-10-CM | POA: Diagnosis not present

## 2017-04-02 DIAGNOSIS — M25662 Stiffness of left knee, not elsewhere classified: Secondary | ICD-10-CM | POA: Diagnosis not present

## 2017-04-04 DIAGNOSIS — M6281 Muscle weakness (generalized): Secondary | ICD-10-CM | POA: Diagnosis not present

## 2017-04-04 DIAGNOSIS — R262 Difficulty in walking, not elsewhere classified: Secondary | ICD-10-CM | POA: Diagnosis not present

## 2017-04-04 DIAGNOSIS — M25662 Stiffness of left knee, not elsewhere classified: Secondary | ICD-10-CM | POA: Diagnosis not present

## 2017-04-04 DIAGNOSIS — M25562 Pain in left knee: Secondary | ICD-10-CM | POA: Diagnosis not present

## 2017-04-08 DIAGNOSIS — R262 Difficulty in walking, not elsewhere classified: Secondary | ICD-10-CM | POA: Diagnosis not present

## 2017-04-08 DIAGNOSIS — M25562 Pain in left knee: Secondary | ICD-10-CM | POA: Diagnosis not present

## 2017-04-08 DIAGNOSIS — M6281 Muscle weakness (generalized): Secondary | ICD-10-CM | POA: Diagnosis not present

## 2017-04-08 DIAGNOSIS — M25662 Stiffness of left knee, not elsewhere classified: Secondary | ICD-10-CM | POA: Diagnosis not present

## 2017-04-15 DIAGNOSIS — R262 Difficulty in walking, not elsewhere classified: Secondary | ICD-10-CM | POA: Diagnosis not present

## 2017-04-15 DIAGNOSIS — R2689 Other abnormalities of gait and mobility: Secondary | ICD-10-CM | POA: Diagnosis not present

## 2017-04-15 DIAGNOSIS — M25662 Stiffness of left knee, not elsewhere classified: Secondary | ICD-10-CM | POA: Diagnosis not present

## 2017-04-15 DIAGNOSIS — M25562 Pain in left knee: Secondary | ICD-10-CM | POA: Diagnosis not present

## 2017-04-17 DIAGNOSIS — M25562 Pain in left knee: Secondary | ICD-10-CM | POA: Diagnosis not present

## 2017-04-17 DIAGNOSIS — M6281 Muscle weakness (generalized): Secondary | ICD-10-CM | POA: Diagnosis not present

## 2017-04-17 DIAGNOSIS — R262 Difficulty in walking, not elsewhere classified: Secondary | ICD-10-CM | POA: Diagnosis not present

## 2017-04-17 DIAGNOSIS — M25662 Stiffness of left knee, not elsewhere classified: Secondary | ICD-10-CM | POA: Diagnosis not present

## 2017-04-24 DIAGNOSIS — M25662 Stiffness of left knee, not elsewhere classified: Secondary | ICD-10-CM | POA: Diagnosis not present

## 2017-04-24 DIAGNOSIS — R262 Difficulty in walking, not elsewhere classified: Secondary | ICD-10-CM | POA: Diagnosis not present

## 2017-04-24 DIAGNOSIS — R2689 Other abnormalities of gait and mobility: Secondary | ICD-10-CM | POA: Diagnosis not present

## 2017-04-24 DIAGNOSIS — M6281 Muscle weakness (generalized): Secondary | ICD-10-CM | POA: Diagnosis not present

## 2017-05-01 DIAGNOSIS — M25562 Pain in left knee: Secondary | ICD-10-CM | POA: Diagnosis not present

## 2017-05-01 DIAGNOSIS — M6281 Muscle weakness (generalized): Secondary | ICD-10-CM | POA: Diagnosis not present

## 2017-05-01 DIAGNOSIS — R2689 Other abnormalities of gait and mobility: Secondary | ICD-10-CM | POA: Diagnosis not present

## 2017-05-08 DIAGNOSIS — M6281 Muscle weakness (generalized): Secondary | ICD-10-CM | POA: Diagnosis not present

## 2017-05-08 DIAGNOSIS — M25662 Stiffness of left knee, not elsewhere classified: Secondary | ICD-10-CM | POA: Diagnosis not present

## 2017-05-08 DIAGNOSIS — M25562 Pain in left knee: Secondary | ICD-10-CM | POA: Diagnosis not present

## 2017-05-08 DIAGNOSIS — R262 Difficulty in walking, not elsewhere classified: Secondary | ICD-10-CM | POA: Diagnosis not present

## 2017-05-15 DIAGNOSIS — M25562 Pain in left knee: Secondary | ICD-10-CM | POA: Diagnosis not present

## 2017-05-15 DIAGNOSIS — R2689 Other abnormalities of gait and mobility: Secondary | ICD-10-CM | POA: Diagnosis not present

## 2017-05-15 DIAGNOSIS — M25662 Stiffness of left knee, not elsewhere classified: Secondary | ICD-10-CM | POA: Diagnosis not present

## 2017-05-15 DIAGNOSIS — R262 Difficulty in walking, not elsewhere classified: Secondary | ICD-10-CM | POA: Diagnosis not present

## 2017-05-18 DIAGNOSIS — Z136 Encounter for screening for cardiovascular disorders: Secondary | ICD-10-CM | POA: Diagnosis not present

## 2017-05-18 DIAGNOSIS — Z713 Dietary counseling and surveillance: Secondary | ICD-10-CM | POA: Diagnosis not present

## 2017-05-18 DIAGNOSIS — Z13220 Encounter for screening for lipoid disorders: Secondary | ICD-10-CM | POA: Diagnosis not present

## 2017-05-18 DIAGNOSIS — Z131 Encounter for screening for diabetes mellitus: Secondary | ICD-10-CM | POA: Diagnosis not present

## 2017-05-18 DIAGNOSIS — E781 Pure hyperglyceridemia: Secondary | ICD-10-CM | POA: Diagnosis not present

## 2017-05-18 DIAGNOSIS — Z6826 Body mass index (BMI) 26.0-26.9, adult: Secondary | ICD-10-CM | POA: Diagnosis not present

## 2017-05-22 DIAGNOSIS — M222X2 Patellofemoral disorders, left knee: Secondary | ICD-10-CM | POA: Diagnosis not present

## 2017-05-23 DIAGNOSIS — M25662 Stiffness of left knee, not elsewhere classified: Secondary | ICD-10-CM | POA: Diagnosis not present

## 2017-05-23 DIAGNOSIS — M6281 Muscle weakness (generalized): Secondary | ICD-10-CM | POA: Diagnosis not present

## 2017-05-23 DIAGNOSIS — R2689 Other abnormalities of gait and mobility: Secondary | ICD-10-CM | POA: Diagnosis not present

## 2017-05-23 DIAGNOSIS — M25562 Pain in left knee: Secondary | ICD-10-CM | POA: Diagnosis not present

## 2017-06-05 DIAGNOSIS — R2689 Other abnormalities of gait and mobility: Secondary | ICD-10-CM | POA: Diagnosis not present

## 2017-06-05 DIAGNOSIS — M25662 Stiffness of left knee, not elsewhere classified: Secondary | ICD-10-CM | POA: Diagnosis not present

## 2017-06-05 DIAGNOSIS — M25562 Pain in left knee: Secondary | ICD-10-CM | POA: Diagnosis not present

## 2017-06-05 DIAGNOSIS — R262 Difficulty in walking, not elsewhere classified: Secondary | ICD-10-CM | POA: Diagnosis not present

## 2017-06-11 DIAGNOSIS — M6281 Muscle weakness (generalized): Secondary | ICD-10-CM | POA: Diagnosis not present

## 2017-06-11 DIAGNOSIS — R262 Difficulty in walking, not elsewhere classified: Secondary | ICD-10-CM | POA: Diagnosis not present

## 2017-06-11 DIAGNOSIS — R2689 Other abnormalities of gait and mobility: Secondary | ICD-10-CM | POA: Diagnosis not present

## 2017-06-11 DIAGNOSIS — M25562 Pain in left knee: Secondary | ICD-10-CM | POA: Diagnosis not present

## 2017-06-19 DIAGNOSIS — R262 Difficulty in walking, not elsewhere classified: Secondary | ICD-10-CM | POA: Diagnosis not present

## 2017-06-19 DIAGNOSIS — R2689 Other abnormalities of gait and mobility: Secondary | ICD-10-CM | POA: Diagnosis not present

## 2017-06-19 DIAGNOSIS — M25562 Pain in left knee: Secondary | ICD-10-CM | POA: Diagnosis not present

## 2017-06-19 DIAGNOSIS — M25662 Stiffness of left knee, not elsewhere classified: Secondary | ICD-10-CM | POA: Diagnosis not present

## 2017-06-26 DIAGNOSIS — M25562 Pain in left knee: Secondary | ICD-10-CM | POA: Diagnosis not present

## 2017-06-26 DIAGNOSIS — R2689 Other abnormalities of gait and mobility: Secondary | ICD-10-CM | POA: Diagnosis not present

## 2017-06-26 DIAGNOSIS — R262 Difficulty in walking, not elsewhere classified: Secondary | ICD-10-CM | POA: Diagnosis not present

## 2017-06-26 DIAGNOSIS — M6281 Muscle weakness (generalized): Secondary | ICD-10-CM | POA: Diagnosis not present

## 2017-07-03 DIAGNOSIS — R2689 Other abnormalities of gait and mobility: Secondary | ICD-10-CM | POA: Diagnosis not present

## 2017-07-03 DIAGNOSIS — M25662 Stiffness of left knee, not elsewhere classified: Secondary | ICD-10-CM | POA: Diagnosis not present

## 2017-07-03 DIAGNOSIS — M25562 Pain in left knee: Secondary | ICD-10-CM | POA: Diagnosis not present

## 2017-07-03 DIAGNOSIS — R262 Difficulty in walking, not elsewhere classified: Secondary | ICD-10-CM | POA: Diagnosis not present

## 2017-07-10 DIAGNOSIS — R2689 Other abnormalities of gait and mobility: Secondary | ICD-10-CM | POA: Diagnosis not present

## 2017-07-10 DIAGNOSIS — M25662 Stiffness of left knee, not elsewhere classified: Secondary | ICD-10-CM | POA: Diagnosis not present

## 2017-07-10 DIAGNOSIS — M25562 Pain in left knee: Secondary | ICD-10-CM | POA: Diagnosis not present

## 2017-07-10 DIAGNOSIS — R262 Difficulty in walking, not elsewhere classified: Secondary | ICD-10-CM | POA: Diagnosis not present

## 2017-07-17 DIAGNOSIS — R262 Difficulty in walking, not elsewhere classified: Secondary | ICD-10-CM | POA: Diagnosis not present

## 2017-07-17 DIAGNOSIS — M25662 Stiffness of left knee, not elsewhere classified: Secondary | ICD-10-CM | POA: Diagnosis not present

## 2017-07-17 DIAGNOSIS — R2689 Other abnormalities of gait and mobility: Secondary | ICD-10-CM | POA: Diagnosis not present

## 2017-07-17 DIAGNOSIS — E538 Deficiency of other specified B group vitamins: Secondary | ICD-10-CM | POA: Diagnosis not present

## 2017-07-17 DIAGNOSIS — M25562 Pain in left knee: Secondary | ICD-10-CM | POA: Diagnosis not present

## 2017-10-23 DIAGNOSIS — Z01419 Encounter for gynecological examination (general) (routine) without abnormal findings: Secondary | ICD-10-CM | POA: Diagnosis not present

## 2017-10-23 DIAGNOSIS — Z6826 Body mass index (BMI) 26.0-26.9, adult: Secondary | ICD-10-CM | POA: Diagnosis not present

## 2017-10-28 ENCOUNTER — Encounter: Payer: Self-pay | Admitting: Family Medicine

## 2017-10-28 ENCOUNTER — Ambulatory Visit (INDEPENDENT_AMBULATORY_CARE_PROVIDER_SITE_OTHER): Payer: BLUE CROSS/BLUE SHIELD | Admitting: Family Medicine

## 2017-10-28 VITALS — BP 120/63 | HR 87 | Ht 67.0 in | Wt 165.0 lb

## 2017-10-28 DIAGNOSIS — Z23 Encounter for immunization: Secondary | ICD-10-CM

## 2017-10-28 DIAGNOSIS — E782 Mixed hyperlipidemia: Secondary | ICD-10-CM

## 2017-10-28 DIAGNOSIS — F418 Other specified anxiety disorders: Secondary | ICD-10-CM

## 2017-10-28 DIAGNOSIS — D510 Vitamin B12 deficiency anemia due to intrinsic factor deficiency: Secondary | ICD-10-CM

## 2017-10-28 DIAGNOSIS — M170 Bilateral primary osteoarthritis of knee: Secondary | ICD-10-CM | POA: Insufficient documentation

## 2017-10-28 DIAGNOSIS — M222X9 Patellofemoral disorders, unspecified knee: Secondary | ICD-10-CM | POA: Insufficient documentation

## 2017-10-28 DIAGNOSIS — R42 Dizziness and giddiness: Secondary | ICD-10-CM

## 2017-10-28 NOTE — Patient Instructions (Signed)
Try the exercises for one week.  If not helping then let me know.

## 2017-10-28 NOTE — Progress Notes (Signed)
Subjective:    Patient ID: Denise Huff, female    DOB: [birth year], 37 y.o.   MRN: [id number]  HPI   37 year old female is here today to establish care.  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  She has a history of pernicious anemia for which she gets the B12 injections.  She also has a history of hyperlipidemia which seems to be genetic and takes fish oil to help control her triglycerides.  She has tried to manage it with her diet in the past and says it does seem to respond to dietary changes that she is difficult to be consistent sometimes.  Her father was diagnosed with coronary artery disease and had a triple bypass at age 63.  She does have a couple of concerns she would like to address today.  In particular she is been experiencing some lightheadedness and feeling like her equilibrium is off.  She says initially she would notice it when she would get out of the car.  We does happen occasionally and sometimes it would happen if she was a driver or even if she was a passenger which is the active getting out of the car would sometimes trigger her feeling a little imbalanced or lightheaded.  Then it started happening almost every time she got out of the car.  Then she was noticing it when she would bend over to put things in the refrigerator or do laundry.  That would seem to trigger her symptoms.  But then today while at work she actually felt a little nauseated had some pain in her epigastric area after taking a sip of a soda and felt very dizzy and lightheaded and like her vision was going to blackout.  She denies any chest pain shortness of breath or palpitations.  She says she drinks water daily and feels like she is well-hydrated.  She did eat this morning.   She has been on Zoloft now for the last 7 years.  She was started on it right after the birth of her son and has never come off of it.  She wants to continue it and feels like it makes a big  difference.  She says sometimes she is not able to cry it something that she feels is sad but feels like the benefits outweigh the negative.  She was having a lot of stressors at her job around that time as well and this is been really helpful.  Review of Systems  Constitutional: Negative for diaphoresis, fever and unexpected weight change.  HENT: Positive for hearing loss. Negative for postnasal drip, sneezing and tinnitus.   Eyes: Negative for visual disturbance.  Respiratory: Negative for cough and wheezing.   Cardiovascular: Negative for chest pain and palpitations.  Genitourinary: Negative for vaginal bleeding and vaginal discharge.  Musculoskeletal: Negative for arthralgias.  Neurological: Positive for dizziness and light-headedness. Negative for headaches.  Hematological: Negative for adenopathy. Does not bruise/bleed easily.      BP 120/63   Pulse 87   Ht 5\' 7"  (1.702 m)   Wt 165 lb (74.8 kg)   LMP 10/07/2017 (Exact Date)   SpO2 100%   BMI 25.84 kg/m     Not on File  No past medical history on file.  Past Surgical History:  Procedure Laterality Date  . APPENDECTOMY    . CESAREAN SECTION    . KNEE SURGERY    . TONSILLECTOMY    . TYMPANOSTOMY TUBE  PLACEMENT      Social History   Socioeconomic History  . Marital status: Married    Spouse name: Theodoro Grist  . Number of children: 2  . Years of education: College   . Highest education level: Not on file  Occupational History  . Occupation: Runner, broadcasting/film/video    Comment: Middle sCHOOL  Social Needs  . Financial resource strain: Not on file  . Food insecurity:    Worry: Not on file    Inability: Not on file  . Transportation needs:    Medical: Not on file    Non-medical: Not on file  Tobacco Use  . Smoking status: Never Smoker  . Smokeless tobacco: Never Used  Substance and Sexual Activity  . Alcohol use: Yes    Comment: rarely  . Drug use: Never  . Sexual activity: Yes    Partners: Male    Birth control/protection:  Condom  Lifestyle  . Physical activity:    Days per week: Not on file    Minutes per session: Not on file  . Stress: Not on file  Relationships  . Social connections:    Talks on phone: Not on file    Gets together: Not on file    Attends religious service: Not on file    Active member of club or organization: Not on file    Attends meetings of clubs or organizations: Not on file    Relationship status: Not on file  . Intimate partner violence:    Fear of current or ex partner: Not on file    Emotionally abused: Not on file    Physically abused: Not on file    Forced sexual activity: Not on file  Other Topics Concern  . Not on file  Social History Narrative  . Not on file    Family History  Problem Relation Age of Onset  . Skin cancer Mother   . CAD Father 43       triple bypass   . Hypertension Father   . Skin cancer Father   . Diabetes Father   . Polycystic ovary syndrome Sister     Outpatient Encounter Medications as of 10/28/2017  Medication Sig  . ALPRAZolam (XANAX) 0.25 MG tablet Take 0.25 mg by mouth at bedtime as needed for anxiety or sleep.  . cyanocobalamin (,VITAMIN B-12,) 1000 MCG/ML injection Inject 1 mL into the muscle once a week.  . Omega-3 Fatty Acids (FISH OIL) 1000 MG CAPS Take 1 capsule by mouth daily.  . sertraline (ZOLOFT) 100 MG tablet Take 100 mg by mouth daily.  . [DISCONTINUED] DOCOSAHEXAENOIC ACID PO Take 1 g by mouth at bedtime.   No facility-administered encounter medications on file as of 10/28/2017.          Objective:   Physical Exam  Constitutional: She is oriented to person, place, and time. She appears well-developed and well-nourished.  HENT:  Head: Normocephalic and atraumatic.  Right Ear: External ear normal.  Left Ear: External ear normal.  Nose: Nose normal.  Mouth/Throat: Oropharynx is clear and moist.  TMs and canals are clear. Cerumen blocking most of TM on the right.   Eyes: Pupils are equal, round, and reactive to  light. Conjunctivae and EOM are normal.  Neck: Neck supple. No thyromegaly present.  Cardiovascular: Normal rate, regular rhythm and normal heart sounds.  Pulmonary/Chest: Effort normal and breath sounds normal. She has no wheezes.  Lymphadenopathy:    She has no cervical adenopathy.  Neurological: She is alert  and oriented to person, place, and time.  Perform Dix-Hallpike maneuver on her.  She had just some slight nystagmus when we went to the left but it did not intensely re-create her symptoms.  Skin: Skin is warm and dry.  Psychiatric: She has a normal mood and affect.      Assessment & Plan:  Dizziness/vertigo-it definitely sounds like her vertigo is triggered by position change of her head most consistent with BPPV.  We did do a Dix-Hallpike maneuver and I felt like she had a little bit of nystagmus when we went to the left but it did not re-create her symptoms with intensity.  I think it is worth her trying to do the home maneuvers for at least the next week just to see if it improves her symptoms and if it does not then we can work-up further.  She is not having any chest symptoms whatsoever heart rate is normal on exam no murmur.  Unlikely to be cardiac in nature.  No sign of stroke however reassured her since this seems to be triggered by position change which is not typical with stroke.  B12 deficiency-continue with weekly injections.  Last B12 was done at [facility].  Hyperlipidemia with strong family history of heart disease-just continue to work on healthy diet and regulargord exercise to bring this under good control.  Pression with anxiety-currently on Zoloft 100 mg daily.  Plans on staying on it for now.

## 2017-11-29 ENCOUNTER — Encounter: Payer: Self-pay | Admitting: Family Medicine

## 2018-03-25 ENCOUNTER — Telehealth: Payer: Self-pay

## 2018-03-25 DIAGNOSIS — Z0000 Encounter for general adult medical examination without abnormal findings: Secondary | ICD-10-CM

## 2018-03-25 DIAGNOSIS — R7309 Other abnormal glucose: Secondary | ICD-10-CM

## 2018-03-25 DIAGNOSIS — E782 Mixed hyperlipidemia: Secondary | ICD-10-CM

## 2018-03-25 DIAGNOSIS — D510 Vitamin B12 deficiency anemia due to intrinsic factor deficiency: Secondary | ICD-10-CM

## 2018-03-25 NOTE — Telephone Encounter (Signed)
Denise Huff has an up coming physical. She would like to get labs done before her physical. She was advised the labs will be entered by tomorrow. Pended labs.

## 2018-03-26 NOTE — Telephone Encounter (Signed)
Signed and printed

## 2018-03-27 ENCOUNTER — Encounter: Payer: BLUE CROSS/BLUE SHIELD | Admitting: Family Medicine

## 2018-04-03 DIAGNOSIS — R7309 Other abnormal glucose: Secondary | ICD-10-CM | POA: Diagnosis not present

## 2018-04-03 DIAGNOSIS — Z0000 Encounter for general adult medical examination without abnormal findings: Secondary | ICD-10-CM | POA: Diagnosis not present

## 2018-04-03 DIAGNOSIS — E782 Mixed hyperlipidemia: Secondary | ICD-10-CM | POA: Diagnosis not present

## 2018-04-03 DIAGNOSIS — D510 Vitamin B12 deficiency anemia due to intrinsic factor deficiency: Secondary | ICD-10-CM | POA: Diagnosis not present

## 2018-04-04 LAB — COMPLETE METABOLIC PANEL WITH GFR
AG RATIO: 1.7 (calc) (ref 1.0–2.5)
ALKALINE PHOSPHATASE (APISO): 39 U/L (ref 31–125)
ALT: 10 U/L (ref 6–29)
AST: 16 U/L (ref 10–30)
Albumin: 4.3 g/dL (ref 3.6–5.1)
BILIRUBIN TOTAL: 0.7 mg/dL (ref 0.2–1.2)
BUN: 11 mg/dL (ref 7–25)
CALCIUM: 9.3 mg/dL (ref 8.6–10.2)
CHLORIDE: 106 mmol/L (ref 98–110)
CO2: 27 mmol/L (ref 20–32)
Creat: 0.83 mg/dL (ref 0.50–1.10)
GFR, Est African American: 105 mL/min/{1.73_m2} (ref >=60)
GFR, Est Non African American: 91 mL/min/{1.73_m2} (ref >=60)
Globulin: 2.5 g/dL (calc) (ref 1.9–3.7)
Glucose, Bld: 89 mg/dL (ref 65–99)
POTASSIUM: 4.2 mmol/L (ref 3.5–5.3)
Sodium: 140 mmol/L (ref 135–146)
Total Protein: 6.8 g/dL (ref 6.1–8.1)

## 2018-04-04 LAB — CBC WITH DIFFERENTIAL/PLATELET
Absolute Monocytes: 440 cells/uL (ref 200–950)
BASOS ABS: 50 {cells}/uL (ref 0–200)
Basophils Relative: 0.9 %
EOS ABS: 72 {cells}/uL (ref 15–500)
EOS PCT: 1.3 %
HEMATOCRIT: 36 % (ref 35.0–45.0)
HEMOGLOBIN: 12.3 g/dL (ref 11.7–15.5)
LYMPHS ABS: 1623 {cells}/uL (ref 850–3900)
MCH: 31.6 pg (ref 27.0–33.0)
MCHC: 34.2 g/dL (ref 32.0–36.0)
MCV: 92.5 fL (ref 80.0–100.0)
MPV: 10 fL (ref 7.5–12.5)
Monocytes Relative: 8 %
NEUTROS PCT: 60.3 %
Neutro Abs: 3317 cells/uL (ref 1500–7800)
Platelets: 294 10*3/uL (ref 140–400)
RBC: 3.89 10*6/uL (ref 3.80–5.10)
RDW: 12 % (ref 11.0–15.0)
Total Lymphocyte: 29.5 %
WBC: 5.5 10*3/uL (ref 3.8–10.8)

## 2018-04-04 LAB — HEMOGLOBIN A1C
HEMOGLOBIN A1C: 5.2 %{Hb} (ref <5.7)
MEAN PLASMA GLUCOSE: 103 (calc)
eAG (mmol/L): 5.7 (calc)

## 2018-04-04 LAB — LIPID PANEL W/REFLEX DIRECT LDL
Cholesterol: 221 mg/dL — ABNORMAL HIGH (ref <200)
HDL: 50 mg/dL (ref >=50)
LDL Cholesterol (Calc): 140 mg/dL (calc) — ABNORMAL HIGH
NON-HDL CHOLESTEROL (CALC): 171 mg/dL — AB (ref <130)
TRIGLYCERIDES: 175 mg/dL — AB (ref <150)
Total CHOL/HDL Ratio: 4.4 (calc) (ref <5.0)

## 2018-04-04 LAB — TSH: TSH: 0.65 m[IU]/L

## 2018-04-04 LAB — VITAMIN B12: VITAMIN B 12: 656 pg/mL (ref 200–1100)

## 2018-04-10 ENCOUNTER — Encounter: Payer: BLUE CROSS/BLUE SHIELD | Admitting: Family Medicine

## 2018-04-22 ENCOUNTER — Ambulatory Visit (INDEPENDENT_AMBULATORY_CARE_PROVIDER_SITE_OTHER): Payer: BLUE CROSS/BLUE SHIELD | Admitting: Family Medicine

## 2018-04-22 ENCOUNTER — Encounter: Payer: Self-pay | Admitting: Family Medicine

## 2018-04-22 VITALS — BP 112/68 | HR 76 | Ht 67.0 in | Wt 166.0 lb

## 2018-04-22 DIAGNOSIS — Z0000 Encounter for general adult medical examination without abnormal findings: Secondary | ICD-10-CM

## 2018-04-22 DIAGNOSIS — E785 Hyperlipidemia, unspecified: Secondary | ICD-10-CM | POA: Diagnosis not present

## 2018-04-22 MED ORDER — ATORVASTATIN CALCIUM 20 MG PO TABS: 20.0000 mg | ORAL_TABLET | Freq: Every day | ORAL | 3 refills | Status: DC

## 2018-04-22 NOTE — Progress Notes (Signed)
Subjective:     Denise Huff is a 38 y.o. female and is here for a comprehensive physical exam. The patient reports no problems.  She is doing well.  She does taekwondo twice a week and has a very active job.  She did want to discuss her lab results that she still has a elevated cholesterol which has been present at least since her early 92s.  She does have a strong family history of heart disease and is concerned about it.  She and her husband are not planning on having anymore children.  Social History   Socioeconomic History  . Marital status: Married    Spouse name: Theodoro Grist  . Number of children: 2  . Years of education: College   . Highest education level: Not on file  Occupational History  . Occupation: Runner, broadcasting/film/video    Comment: Middle sCHOOL  Social Needs  . Financial resource strain: Not on file  . Food insecurity:    Worry: Not on file    Inability: Not on file  . Transportation needs:    Medical: Not on file    Non-medical: Not on file  Tobacco Use  . Smoking status: Never Smoker  . Smokeless tobacco: Never Used  Substance and Sexual Activity  . Alcohol use: Yes    Comment: rarely  . Drug use: Never  . Sexual activity: Yes    Partners: Male    Birth control/protection: Condom  Lifestyle  . Physical activity:    Days per week: Not on file    Minutes per session: Not on file  . Stress: Not on file  Relationships  . Social connections:    Talks on phone: Not on file    Gets together: Not on file    Attends religious service: Not on file    Active member of club or organization: Not on file    Attends meetings of clubs or organizations: Not on file    Relationship status: Not on file  . Intimate partner violence:    Fear of current or ex partner: Not on file    Emotionally abused: Not on file    Physically abused: Not on file    Forced sexual activity: Not on file  Other Topics Concern  . Not on file  Social History Narrative  . Not on file   Health  Maintenance  Topic Date Due  . HIV Screening  11/06/1996  . PAP SMEAR-Modifier  11/07/2002  . TETANUS/TDAP  10/19/2023  . INFLUENZA VACCINE  Completed    The following portions of the patient's history were reviewed and updated as appropriate: allergies, current medications, past family history, past medical history, past social history, past surgical history and problem list.  Review of Systems A comprehensive review of systems was negative.   Objective:    BP 112/68   Pulse 76   Ht 5\' 7"  (1.702 m)   Wt 166 lb (75.3 kg)   SpO2 98%   BMI 26.00 kg/m  General appearance: alert, cooperative and appears stated age Head: Normocephalic, without obvious abnormality, atraumatic Eyes: conj clear, EOMI, PEERLA Ears: normal TM's and external ear canals both ears Nose: Nares normal. Septum midline. Mucosa normal. No drainage or sinus tenderness. Throat: lips, mucosa, and tongue normal; teeth and gums normal Neck: no adenopathy, no carotid bruit, no JVD, supple, symmetrical, trachea midline and thyroid not enlarged, symmetric, no tenderness/mass/nodules Back: symmetric, no curvature. ROM normal. No CVA tenderness. Lungs: clear to auscultation bilaterally Heart: regular  rate and rhythm, S1, S2 normal, no murmur, click, rub or gallop Abdomen: soft, non-tender; bowel sounds normal; no masses,  no organomegaly Extremities: extremities normal, atraumatic, no cyanosis or edema Pulses: 2+ and symmetric Skin: Skin color, texture, turgor normal. No rashes or lesions Lymph nodes: Cervical adenopathy: nl and Supraclavicular adenopathy: nl Neurologic: Alert and oriented X 3, normal strength and tone. Normal symmetric reflexes. Normal coordination and gait    Assessment:    Healthy female exam.     Keep up a regular exercise program and make sure you are eating a healthy diet Try to eat 4 servings of dairy a day, or if you are lactose intolerant take a calcium with vitamin D daily.  Your vaccines  are up to date.      Plan:     See After Visit Summary for Counseling Recommendations    complete physical examination Keep up a regular exercise program and make sure you are eating a healthy diet Try to eat 4 servings of dairy a day, or if you are lactose intolerant take a calcium with vitamin D daily.  Your vaccines are up to date.   Hyperlipidemia-discussed options. She wants to start a statin early.  We did discuss contraindication with pregnancy but she and her husband are not planning on getting pregnant.  Problems or side effects or concerns with the medication.  Will call for copy of  Pap smear from [facility].

## 2018-04-22 NOTE — Patient Instructions (Signed)

## 2018-05-12 ENCOUNTER — Other Ambulatory Visit: Payer: Self-pay

## 2018-05-12 MED ORDER — ALPRAZOLAM 0.25 MG PO TABS: 0.2500 mg | ORAL_TABLET | Freq: Every evening | ORAL | 1 refills | Status: DC | PRN

## 2018-05-12 NOTE — Telephone Encounter (Signed)
Denise Huff called for a refill on Xanax. She states she was advised to call for a refill. Historical provider.

## 2018-05-19 ENCOUNTER — Ambulatory Visit: Payer: BLUE CROSS/BLUE SHIELD | Admitting: Sports Medicine

## 2018-05-19 ENCOUNTER — Other Ambulatory Visit: Payer: Self-pay

## 2018-05-19 ENCOUNTER — Encounter: Payer: Self-pay | Admitting: Sports Medicine

## 2018-05-19 DIAGNOSIS — L918 Other hypertrophic disorders of the skin: Secondary | ICD-10-CM | POA: Diagnosis not present

## 2018-05-19 MED ORDER — FLUCONAZOLE 150 MG PO TABS: 150.0000 mg | ORAL_TABLET | Freq: Once | ORAL | 0 refills | Status: AC

## 2018-05-19 MED ORDER — DOXYCYCLINE HYCLATE 100 MG PO TABS: 100.0000 mg | ORAL_TABLET | Freq: Two times a day (BID) | ORAL | 0 refills | Status: AC

## 2018-05-19 NOTE — Assessment & Plan Note (Addendum)
Cryotherapy of skin tags on the right nostril, as well as the right and left lower abdomen. The one on the right nostril was a bit inflamed, adding doxycycline and Diflucan. Return 2 weeks for repeat treatment if needed.

## 2018-05-19 NOTE — Progress Notes (Signed)
Subjective:    CC: Lump on nose  HPI: This is a pleasant 38 year old female, she noted a lump on her right nostril, she also has a skin lesion on her right and left lower abdomen.  Symptoms are moderate, persistent, localized without radiation.  Present for several months.  I reviewed the past medical history, family history, social history, surgical history, and allergies today and no changes were needed.  Please see the problem list section below in epic for further details.  Past Medical History: No past medical history on file. Past Surgical History: Past Surgical History:  Procedure Laterality Date  . APPENDECTOMY    . CESAREAN SECTION    . KNEE SURGERY    . TONSILLECTOMY    . TYMPANOSTOMY TUBE PLACEMENT     Social History: Social History   Socioeconomic History  . Marital status: Married    Spouse name: Theodoro Grist  . Number of children: 2  . Years of education: College   . Highest education level: Not on file  Occupational History  . Occupation: Runner, broadcasting/film/video    Comment: Middle sCHOOL  Social Needs  . Financial resource strain: Not on file  . Food insecurity:    Worry: Not on file    Inability: Not on file  . Transportation needs:    Medical: Not on file    Non-medical: Not on file  Tobacco Use  . Smoking status: Never Smoker  . Smokeless tobacco: Never Used  Substance and Sexual Activity  . Alcohol use: Yes    Comment: rarely  . Drug use: Never  . Sexual activity: Yes    Partners: Male    Birth control/protection: Condom  Lifestyle  . Physical activity:    Days per week: Not on file    Minutes per session: Not on file  . Stress: Not on file  Relationships  . Social connections:    Talks on phone: Not on file    Gets together: Not on file    Attends religious service: Not on file    Active member of club or organization: Not on file    Attends meetings of clubs or organizations: Not on file    Relationship status: Not on file  Other Topics Concern  . Not on  file  Social History Narrative  . Not on file   Family History: Family History  Problem Relation Age of Onset  . Skin cancer Mother   . Depression Mother   . CAD Father 65       triple bypass   . Hypertension Father   . Skin cancer Father   . Diabetes Father   . Polycystic ovary syndrome Sister   . Depression Sister    Allergies: No Known Allergies Medications: See med rec.  Review of Systems: No fevers, chills, night sweats, weight loss, chest pain, or shortness of breath.   Objective:    General: Well Developed, well nourished, and in no acute distress.  Neuro: Alert and oriented x3, extra-ocular muscles intact, sensation grossly intact.  HEENT: Normocephalic, atraumatic, pupils equal round reactive to light, neck supple, no masses, no lymphadenopathy, thyroid nonpalpable.  Skin: Warm and dry, no rashes.  There is a erythematous skin tag that appears bacterially superinfected on her right nostril, she also has one skin tag on each side of her lower abdomen. Cardiac: Regular rate and rhythm, no murmurs rubs or gallops, no lower extremity edema.  Respiratory: Clear to auscultation bilaterally. Not using accessory muscles, speaking in full sentences.  Procedure:  Cryodestruction of #3 skin tags Consent obtained and verified. Time-out conducted. Noted no overlying erythema, induration, or other signs of local infection. Completed without difficulty using Cryo-Gun. Advised to call if fevers/chills, erythema, induration, drainage, or persistent bleeding.  Impression and Recommendations:    Skin tag Cryotherapy of skin tags on the right nostril, as well as the right and left lower abdomen. The one on the right nostril was a bit inflamed, adding doxycycline and Diflucan. Return 2 weeks for repeat treatment if needed.   ___________________________________________ Ihor Austin. Benjamin Stain, M.D., ABFM., CAQSM. Primary Care and Sports Medicine [facility]   Adjunct Professor of Family Medicine  [employer]

## 2018-06-02 ENCOUNTER — Ambulatory Visit (INDEPENDENT_AMBULATORY_CARE_PROVIDER_SITE_OTHER): Payer: BLUE CROSS/BLUE SHIELD | Admitting: Sports Medicine

## 2018-06-02 ENCOUNTER — Encounter: Payer: Self-pay | Admitting: Family Medicine

## 2018-06-02 ENCOUNTER — Encounter: Payer: Self-pay | Admitting: Sports Medicine

## 2018-06-02 DIAGNOSIS — L918 Other hypertrophic disorders of the skin: Secondary | ICD-10-CM | POA: Diagnosis not present

## 2018-06-02 NOTE — Assessment & Plan Note (Signed)
I did cryotherapy of a skin tag on her right nostril, right and left lower abdominal walls, everything looks great.

## 2018-06-02 NOTE — Progress Notes (Signed)
Subjective:    CC: Follow-up  HPI: I did cryotherapy of a skin tag on her right nostril, right and left lower abdominal walls, everything looks great.  I reviewed the past medical history, family history, social history, surgical history, and allergies today and no changes were needed.  Please see the problem list section below in epic for further details.  Past Medical History: No past medical history on file. Past Surgical History: Past Surgical History:  Procedure Laterality Date  . APPENDECTOMY    . CESAREAN SECTION    . KNEE SURGERY    . TONSILLECTOMY    . TYMPANOSTOMY TUBE PLACEMENT     Social History: Social History   Socioeconomic History  . Marital status: Married    Spouse name: Theodoro Grist  . Number of children: 2  . Years of education: College   . Highest education level: Not on file  Occupational History  . Occupation: Runner, broadcasting/film/video    Comment: Middle sCHOOL  Social Needs  . Financial resource strain: Not on file  . Food insecurity:    Worry: Not on file    Inability: Not on file  . Transportation needs:    Medical: Not on file    Non-medical: Not on file  Tobacco Use  . Smoking status: Never Smoker  . Smokeless tobacco: Never Used  Substance and Sexual Activity  . Alcohol use: Yes    Comment: rarely  . Drug use: Never  . Sexual activity: Yes    Partners: Male    Birth control/protection: Condom  Lifestyle  . Physical activity:    Days per week: Not on file    Minutes per session: Not on file  . Stress: Not on file  Relationships  . Social connections:    Talks on phone: Not on file    Gets together: Not on file    Attends religious service: Not on file    Active member of club or organization: Not on file    Attends meetings of clubs or organizations: Not on file    Relationship status: Not on file  Other Topics Concern  . Not on file  Social History Narrative  . Not on file   Family History: Family History  Problem Relation Age of Onset  .  Skin cancer Mother   . Depression Mother   . CAD Father 56       triple bypass   . Hypertension Father   . Skin cancer Father   . Diabetes Father   . Polycystic ovary syndrome Sister   . Depression Sister    Allergies: No Known Allergies Medications: See med rec.  Review of Systems: No fevers, chills, night sweats, weight loss, chest pain, or shortness of breath.   Objective:    General: Well Developed, well nourished, and in no acute distress.  Neuro: Alert and oriented x3, extra-ocular muscles intact, sensation grossly intact.  HEENT: Normocephalic, atraumatic, pupils equal round reactive to light, neck supple, no masses, no lymphadenopathy, thyroid nonpalpable.  Skin: Warm and dry, no rashes.  Skin tags are gone. Cardiac: Regular rate and rhythm, no murmurs rubs or gallops, no lower extremity edema.  Respiratory: Clear to auscultation bilaterally. Not using accessory muscles, speaking in full sentences.  Impression and Recommendations:    Skin tag I did cryotherapy of a skin tag on her right nostril, right and left lower abdominal walls, everything looks great.   ___________________________________________ Ihor Austin. Benjamin Stain, M.D., ABFM., CAQSM. Primary Care and Sports Medicine [facility]  [facility]  Adjunct Professor of [employer]

## 2018-10-06 ENCOUNTER — Telehealth: Payer: Self-pay

## 2018-10-06 DIAGNOSIS — D510 Vitamin B12 deficiency anemia due to intrinsic factor deficiency: Secondary | ICD-10-CM

## 2018-10-06 NOTE — Telephone Encounter (Signed)
Pt called requesting refills be sent to [facility] for B12 injections.   These are on her med list but written by historical provider.   RX pended

## 2018-10-07 MED ORDER — CYANOCOBALAMIN 1000 MCG/ML IJ SOLN: 1000.0000 ug | INTRAMUSCULAR | 11 refills | Status: DC

## 2018-10-07 NOTE — Telephone Encounter (Signed)
Ok , rx sent. She is due for B12 labs.  Encourage her to go right before her next shot is due.

## 2018-10-07 NOTE — Telephone Encounter (Signed)
Spoke with patient and she is aware Rx was sent and she needs to have labs drawn before her next B12 injection.  She plans to go this Friday, August 21st to [facility].

## 2018-10-10 DIAGNOSIS — D510 Vitamin B12 deficiency anemia due to intrinsic factor deficiency: Secondary | ICD-10-CM | POA: Diagnosis not present

## 2018-10-11 LAB — VITAMIN B12: Vitamin B-12: 789 pg/mL (ref 200–1100)

## 2018-10-13 ENCOUNTER — Encounter: Payer: Self-pay | Admitting: Family Medicine

## 2018-10-14 ENCOUNTER — Other Ambulatory Visit: Payer: Self-pay | Admitting: *Deleted

## 2018-10-14 ENCOUNTER — Encounter: Payer: Self-pay | Admitting: Family Medicine

## 2018-10-14 DIAGNOSIS — D510 Vitamin B12 deficiency anemia due to intrinsic factor deficiency: Secondary | ICD-10-CM

## 2018-10-29 DIAGNOSIS — M25512 Pain in left shoulder: Secondary | ICD-10-CM | POA: Diagnosis not present

## 2018-10-29 DIAGNOSIS — Z01419 Encounter for gynecological examination (general) (routine) without abnormal findings: Secondary | ICD-10-CM | POA: Diagnosis not present

## 2018-10-29 DIAGNOSIS — Z6827 Body mass index (BMI) 27.0-27.9, adult: Secondary | ICD-10-CM | POA: Diagnosis not present

## 2018-11-21 ENCOUNTER — Encounter: Payer: Self-pay | Admitting: Family Medicine

## 2018-11-21 DIAGNOSIS — D510 Vitamin B12 deficiency anemia due to intrinsic factor deficiency: Secondary | ICD-10-CM | POA: Diagnosis not present

## 2018-11-22 LAB — VITAMIN B12: Vitamin B-12: 637 pg/mL (ref 200–1100)

## 2018-11-24 NOTE — Progress Notes (Signed)
All labs are normal. 

## 2018-11-25 ENCOUNTER — Telehealth (INDEPENDENT_AMBULATORY_CARE_PROVIDER_SITE_OTHER): Payer: BC Managed Care – PPO | Admitting: Family Medicine

## 2018-11-25 ENCOUNTER — Encounter: Payer: Self-pay | Admitting: Family Medicine

## 2018-11-25 VITALS — Temp 97.6°F | Wt 165.0 lb

## 2018-11-25 DIAGNOSIS — F418 Other specified anxiety disorders: Secondary | ICD-10-CM

## 2018-11-25 MED ORDER — ALPRAZOLAM 0.25 MG PO TABS: 0.2500 mg | ORAL_TABLET | Freq: Every evening | ORAL | 1 refills | Status: DC | PRN

## 2018-11-25 MED ORDER — SERTRALINE HCL 100 MG PO TABS: 150.0000 mg | ORAL_TABLET | Freq: Every day | ORAL | 1 refills | Status: DC

## 2018-11-25 NOTE — Progress Notes (Signed)
Virtual Visit via Video Note  I connected with Denise SchmidtChristina M Fraizer on 11/25/18 at  1:20 PM EDT by a video enabled telemedicine application and verified that I am speaking with the correct person using two identifiers.   I discussed the limitations of evaluation and management by telemedicine and the availability of in person appointments. The patient expressed understanding and agreed to proceed.   Acute Office Visit  Subjective:    Patient ID: Denise Huff, female    DOB: Aug 11, 1980, 38 y.o.   MRN: [id number]  Chief Complaint  Patient presents with  . mood    HPI Patient is in today for increased chest anxiety.  She has a history of depression with anxiety.  Over the last 3 months and really since COVID hit her anxiety levels have really ramped up in.  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  She does not feel safe about it.  She does have a history of pernicious anemia and is on replacement therapy but otherwise is not high risk for COVID.  She is currently on sertraline 100 mg daily.  She has been using her alprazolam more often over the last 3 months which is in part why she had reached out.  She does try to use it sparingly.  She just feels like her anxiety levels are going from 0-10 very quickly to the point where she then feels overwhelmed and cannot get back on track.  No past medical history on file.  Past Surgical History:  Procedure Laterality Date  . APPENDECTOMY    . CESAREAN SECTION    . KNEE SURGERY    . TONSILLECTOMY    . TYMPANOSTOMY TUBE PLACEMENT      Family History  Problem Relation Age of Onset  . Skin cancer Mother   . Depression Mother   . CAD Father [id number]       triple bypass   . Hypertension Father   . Skin cancer Father   . Diabetes Father   . Polycystic ovary syndrome Sister   . Depression Sister     Social History   Socioeconomic History  .  Marital status: Married    Spouse name: Theodoro GristDave  . Number of children: 2  . Years of education: College   . Highest education level: Not on file  Occupational History  . 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 Financial resource strain: Not on file  . Food insecurity    Worry: Not on file    Inability: Not on file  . Transportation needs    Medical: Not on file    Non-medical: Not on file  Tobacco Use  . Smoking status: Never Smoker  . Smokeless tobacco: Never Used  Substance and Sexual Activity  . Alcohol use: Yes    Comment: rarely  . Drug use: Never  . Sexual activity: Yes    Partners: Male    Birth control/protection: Condom  Lifestyle  . Physical activity    Days per week: Not on file    Minutes per session: Not on file  . Stress: Not on file  Relationships  . Social Musicianconnections    Talks on phone: Not on file    Gets together: Not on file    Attends religious service: Not on file    Active member of club or  organization: Not on file    Attends meetings of clubs or organizations: Not on file    Relationship status: Not on file  . Intimate partner violence    Fear of current or ex partner: Not on file    Emotionally abused: Not on file    Physically abused: Not on file    Forced sexual activity: Not on file  Other Topics Concern  . Not on file  Social History Narrative  . Not on file    Outpatient Medications Prior to Visit  Medication Sig Dispense Refill  . atorvastatin (LIPITOR) 20 MG tablet Take 1 tablet (20 mg total) by mouth at bedtime. 90 tablet 3  . cyanocobalamin (,VITAMIN B-12,) 1000 MCG/ML injection Inject 1 mL (1,000 mcg total) into the muscle once a week. 1 mL 11  . LO LOESTRIN FE 1 MG-10 MCG / 10 MCG tablet Take 1 tablet by mouth daily.    Marland Kitchen ALPRAZolam (XANAX) 0.25 MG tablet Take 1 tablet (0.25 mg total) by mouth at bedtime as needed for anxiety or sleep. 30 tablet 1  . sertraline (ZOLOFT) 100 MG tablet Take 100 mg by mouth  daily.  0   No facility-administered medications prior to visit.     No Known Allergies  ROS     Objective:    Physical Exam  Temp 97.6 F (36.4 C)   Wt 165 lb (74.8 kg)   LMP 11/21/2018 (Exact Date)   BMI 25.84 kg/m  Wt Readings from Last 3 Encounters:  11/25/18 165 lb (74.8 kg)  06/02/18 170 lb (77.1 kg)  05/19/18 169 lb (76.7 kg)    Health Maintenance Due  Topic Date Due  . PAP SMEAR-Modifier  08/05/2018    There are no preventive care reminders to display for this patient.   Lab Results  Component Value Date   TSH 0.65 04/03/2018   Lab Results  Component Value Date   WBC 5.5 04/03/2018   HGB 12.3 04/03/2018   HCT 36.0 04/03/2018   MCV 92.5 04/03/2018   PLT 294 04/03/2018   Lab Results  Component Value Date   NA 140 04/03/2018   K 4.2 04/03/2018   CO2 27 04/03/2018   GLUCOSE 89 04/03/2018   BUN 11 04/03/2018   CREATININE 0.83 04/03/2018   BILITOT 0.7 04/03/2018   AST 16 04/03/2018   ALT 10 04/03/2018   PROT 6.8 04/03/2018   CALCIUM 9.3 04/03/2018   Lab Results  Component Value Date   CHOL 221 (H) 04/03/2018   Lab Results  Component Value Date   HDL 50 04/03/2018   Lab Results  Component Value Date   LDLCALC 140 (H) 04/03/2018   Lab Results  Component Value Date   TRIG 175 (H) 04/03/2018   Lab Results  Component Value Date   CHOLHDL 4.4 04/03/2018   Lab Results  Component Value Date   HGBA1C 5.2 04/03/2018       Assessment & Plan:   Problem List Items Addressed This Visit      Other   Depression with anxiety - Primary   Relevant Medications   sertraline (ZOLOFT) 100 MG tablet   ALPRAZolam (XANAX) 0.25 MG tablet   Other Relevant Orders   Ambulatory referral to [place] with particularly heightened anxiety levels during the COVID pandemic.  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  We discussed strategies around trying to calm herself and trying to focus on  other things.  She could certainly consider things like deep breathing or focusing or using imagery.  We also discussed referring to a behavioral health specialist who can really help her and coach her through how to learn these techniques and practice them.  And reminded her that they do actually have to be practiced to actually work.  In the interim are also going to increase her sertraline 250 mg daily.  I did refill her alprazolam today but again want her to use it very sparingly.  I would like to touch base with her again in about 6 to 8 weeks to make sure that she is doing okay.  Meds ordered this encounter  Medications  . sertraline (ZOLOFT) 100 MG tablet    Sig: Take 1.5 tablets (150 mg total) by mouth daily.    Dispense:  45 tablet    Refill:  1  . ALPRAZolam (XANAX) 0.25 MG tablet    Sig: Take 1 tablet (0.25 mg total) by mouth at bedtime as needed for anxiety or sleep.    Dispense:  30 tablet    Refill:  1    I discussed the assessment and treatment plan with the patient. The patient was provided an opportunity to ask questions and all were answered. The patient agreed with the plan and demonstrated an understanding of the instructions.   The patient was advised to call back or seek an in-person evaluation if the symptoms worsen or if the condition fails to improve as anticipated.   Nani Gasser, MD

## 2018-11-25 NOTE — Progress Notes (Signed)
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 I'd also like to discuss my risk of contracting COVID due to the anemia lowering my immune fighting ability.   Maryruth Eve, Lahoma Crocker, CMA

## 2018-12-01 ENCOUNTER — Telehealth: Payer: BLUE CROSS/BLUE SHIELD | Admitting: Family Medicine

## 2018-12-11 ENCOUNTER — Encounter: Payer: Self-pay | Admitting: Family Medicine

## 2018-12-16 DIAGNOSIS — Z20828 Contact with and (suspected) exposure to other viral communicable diseases: Secondary | ICD-10-CM | POA: Diagnosis not present

## 2018-12-16 DIAGNOSIS — B349 Viral infection, unspecified: Secondary | ICD-10-CM | POA: Diagnosis not present

## 2018-12-17 ENCOUNTER — Ambulatory Visit: Payer: BC Managed Care – PPO

## 2018-12-17 ENCOUNTER — Other Ambulatory Visit: Payer: Self-pay

## 2018-12-18 ENCOUNTER — Telehealth: Payer: Self-pay

## 2018-12-18 NOTE — Telephone Encounter (Signed)
Patient presented to office for flu shot on 12/17/18 and advised office staff she had a rapid COVID test done on 12/16/18 and results were negative. Patient did answer yes to COVID screening questions and was still symptomatic. After discussing with Dr Sheppard Coil (Dr Metheney's half day), patient was NOT given flu shot and was advised to return in 2 weeks or after SX resolved for flu shot.   I discussed later with Dr Madilyn Fireman and she felt it was appropriate for patient to get re-tested with a regular, not rapid, test.   I called and left pt a msg that Dr Madilyn Fireman advised to get re-tested at the [facility], due to possible differences in testing sensitivities. Call back information was provided for patient.

## 2018-12-23 ENCOUNTER — Other Ambulatory Visit: Payer: Self-pay | Admitting: Family Medicine

## 2019-01-05 ENCOUNTER — Emergency Department
Admission: EM | Admit: 2019-01-05 | Discharge: 2019-01-05 | Disposition: A | Discharge status: Home / Self Care | Payer: BC Managed Care – PPO | Source: Home / Self Care | Attending: Family Medicine | Admitting: Family Medicine

## 2019-01-05 ENCOUNTER — Encounter: Payer: Self-pay | Admitting: Emergency Medicine

## 2019-01-05 ENCOUNTER — Telehealth: Payer: Self-pay

## 2019-01-05 ENCOUNTER — Other Ambulatory Visit: Payer: Self-pay

## 2019-01-05 ENCOUNTER — Emergency Department (INDEPENDENT_AMBULATORY_CARE_PROVIDER_SITE_OTHER): Payer: BC Managed Care – PPO

## 2019-01-05 DIAGNOSIS — R0602 Shortness of breath: Secondary | ICD-10-CM

## 2019-01-05 DIAGNOSIS — R05 Cough: Secondary | ICD-10-CM

## 2019-01-05 DIAGNOSIS — M940 Chondrocostal junction syndrome [Tietze]: Secondary | ICD-10-CM

## 2019-01-05 DIAGNOSIS — R053 Chronic cough: Secondary | ICD-10-CM

## 2019-01-05 IMAGING — DX DG CHEST 2V
2 series · 2 of 2 positions shown · non-contrast
Comparison: None.

CLINICAL DATA: Shortness of breath and cough for 1 month, possible
[JL] exposure

EXAM:
CHEST - 2 VIEW

[chest pa]
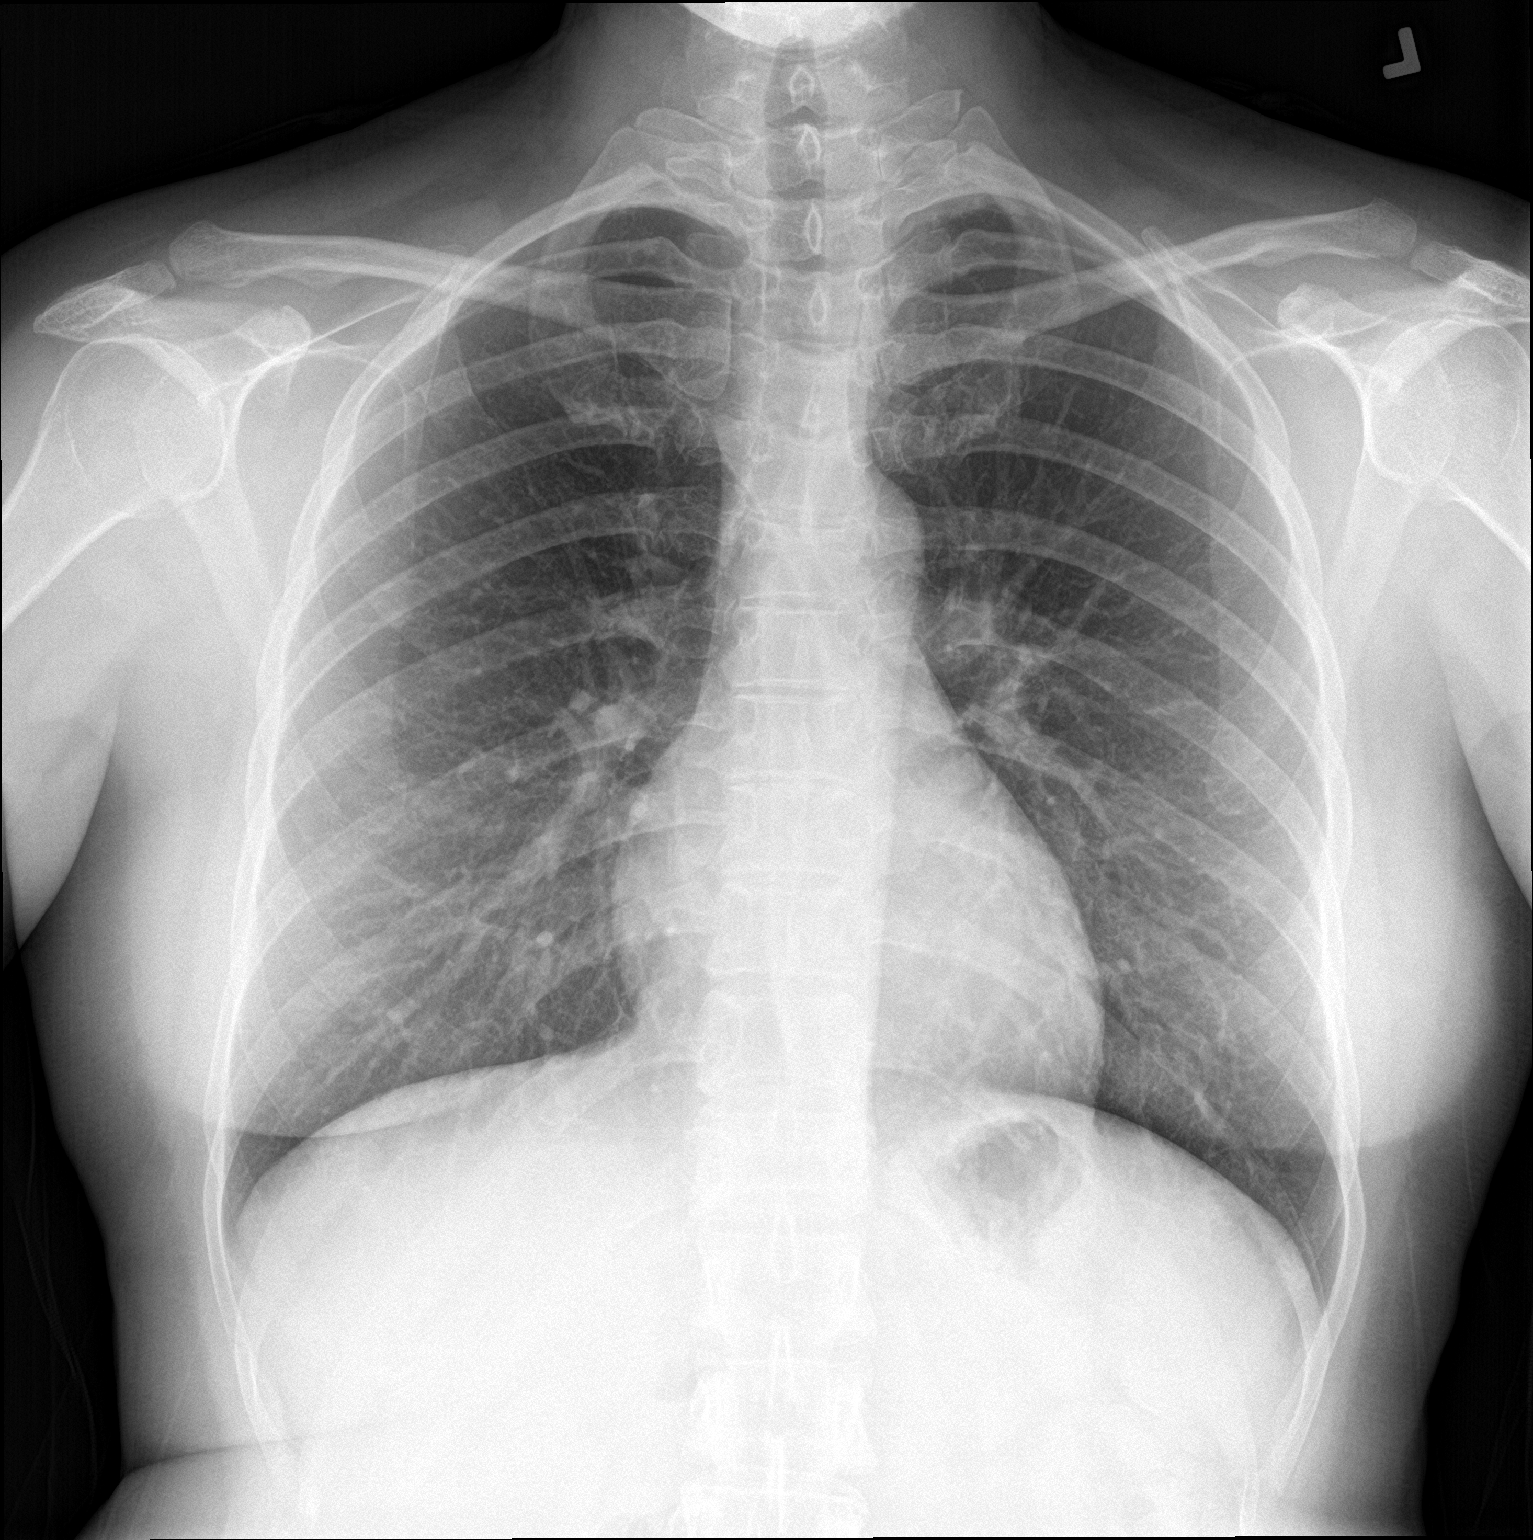

[chest lat]
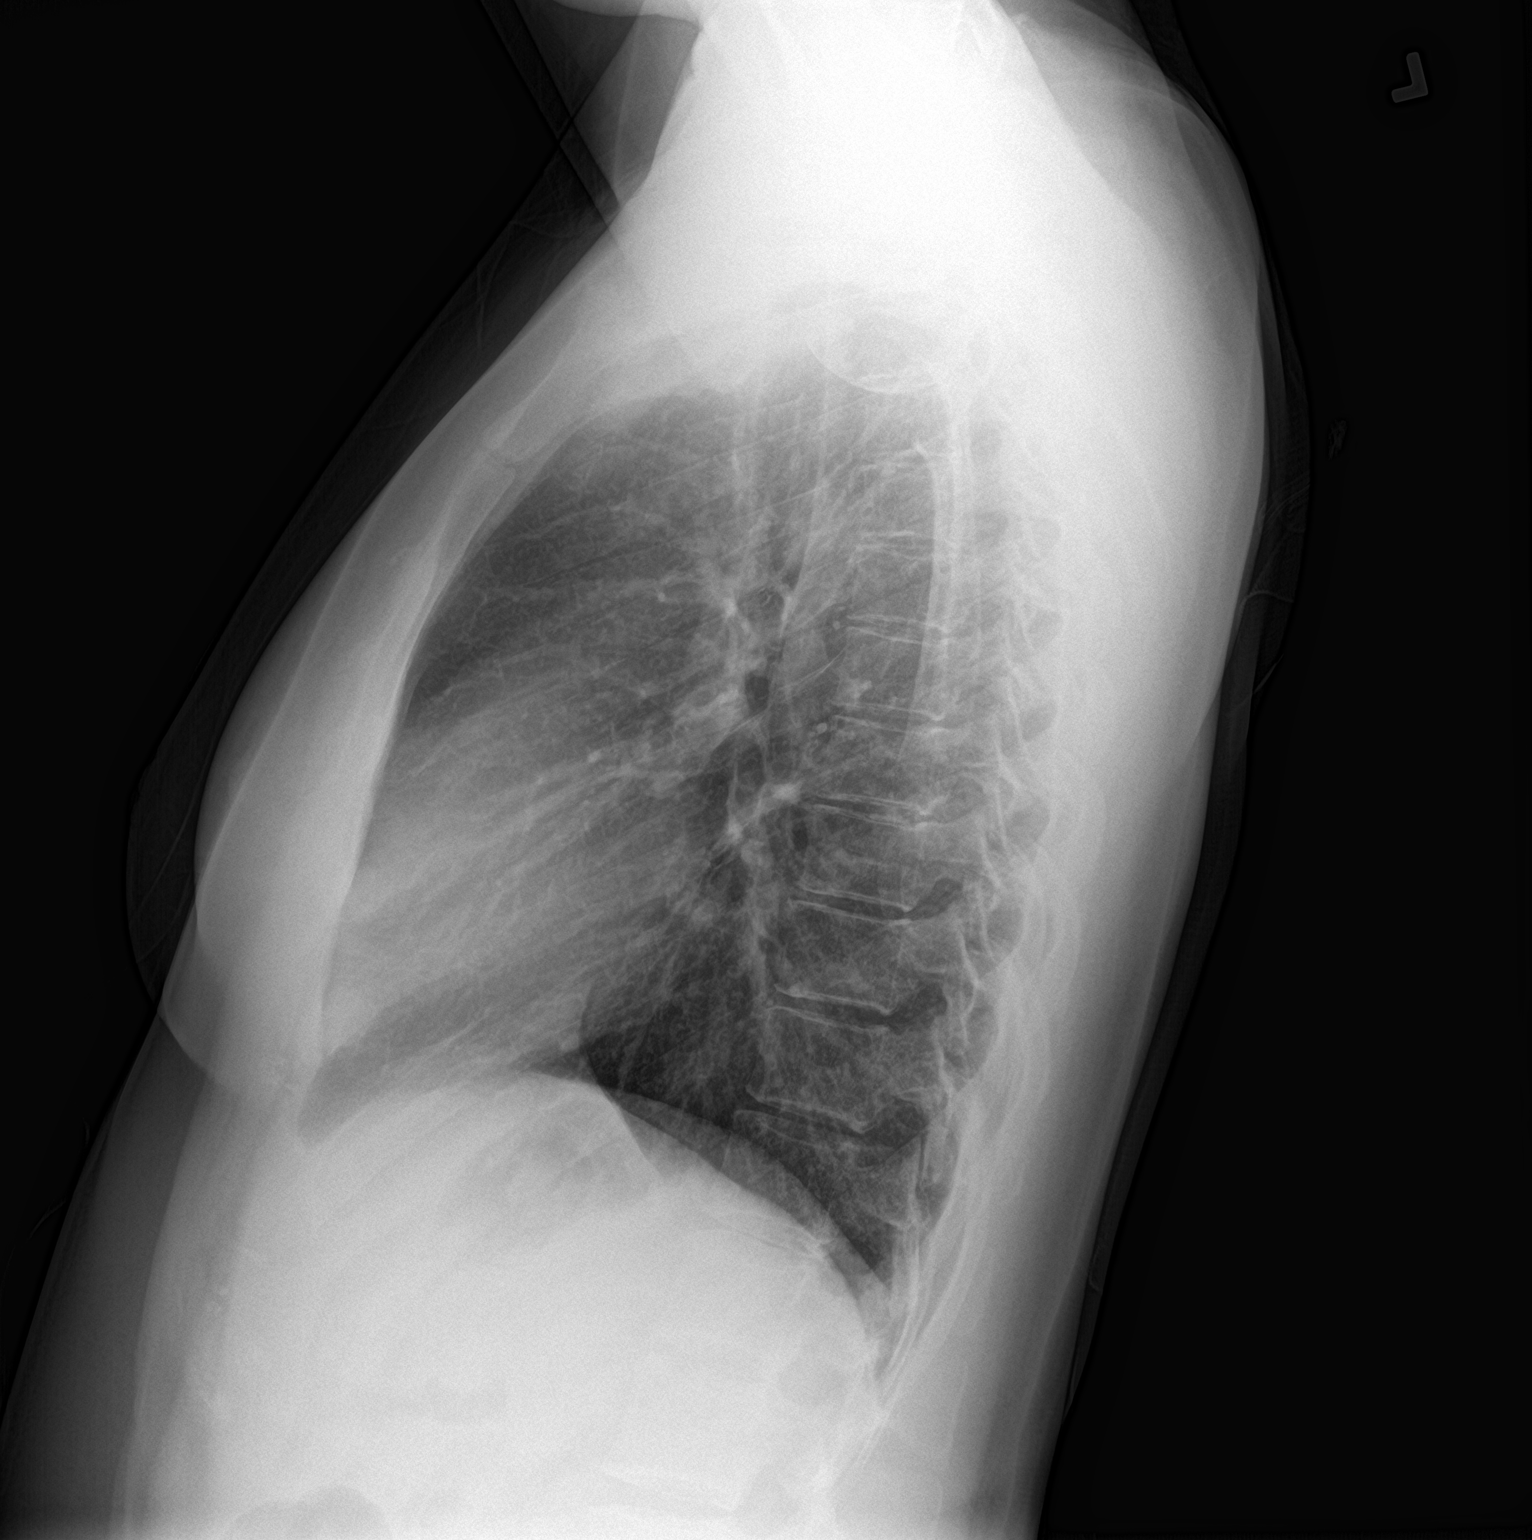

[2 of 2 positions shown; findings below may reference images not displayed]

FINDINGS: The heart size and mediastinal contours are within normal limits.
Both lungs are clear. The visualized skeletal structures are
unremarkable.
IMPRESSION: No active cardiopulmonary disease.

## 2019-01-05 MED ORDER — PREDNISONE 20 MG PO TABS: ORAL_TABLET | ORAL | 0 refills | Status: DC

## 2019-01-05 MED ORDER — GUAIFENESIN-CODEINE 100-10 MG/5ML PO SOLN: ORAL | 0 refills | Status: DC

## 2019-01-05 NOTE — ED Triage Notes (Signed)
Patient here for unresolved cough and shortness of breath; seen 3 weeks ago for same and tested negative for COVID. Now most SOB noticed upon exertion and when lying down. Has albuterol inhaler but has not used recently.

## 2019-01-05 NOTE — Discharge Instructions (Addendum)
Apply ice pack for 20 to 30 minutes, 3 to 4 times daily  Continue until pain decreases.  May take Tylenol daytime if needed for pain. After finishing prednisone, may switch to Ibuprofen 200mg , 4 tabs every 8 hours with food, if needed.

## 2019-01-05 NOTE — Telephone Encounter (Signed)
See note below. Denise Huff was seen at [facility] for cough and shortness of breath. She still has a cough and shortness of breath. I advised we could start here with a virtual visit. She was not comfortable with a virtual visit. She states she is going to go to our Urgent Care.     12/16/2018 [facility] Denise Huff is a 38 y.o. female who presents for evaluation of influenza like symptoms. Symptoms are moderate and include chills, headache, myalgias, productive cough and fever and subjective shortness of breath have been present for 5 days . Denise Huff has tried to alleviate the symptoms with acetaminophen with minimal relief. High risk factors for influenza complications: none. covid-19 exposure

## 2019-01-05 NOTE — ED Provider Notes (Signed)
[facility]    CSN: [id number] Arrival date & time: 01/05/19  1417      History   Chief Complaint Chief Complaint  Patient presents with   Shortness of Breath    HPI AMMIE WARRICK is a 38 y.o. female.   Patient has had a persistent non-productive cough and night sweats for about 4 weeks. She was evaluated for her symptoms 3 weeks ago and a COVID19 test was negative.  During the past two weeks she has had increased shortness of breath on exertion and when supine.  She has developed substernal chest pain/tightness, worse with inspiration, which causes a sensation of shortness of breath.  She denies changes in taste/smell.  She has a past history of pneumonia.  The history is provided by the patient.    History reviewed:  See active problem list  Patient Active Problem List   Diagnosis Date Noted   Skin tag 05/19/2018   Patella-femoral syndrome 10/28/2017   Mixed hyperlipidemia 10/28/2017   Depression with anxiety 10/28/2017   Pernicious anemia 11/27/2016   Atrophy of muscle of left thigh 11/27/2016   Venous insufficiency of left leg 11/27/2016   Sore throat, chronic 12/03/2013   S/P laparoscopic appendectomy 11/01/2010    Past Surgical History:  Procedure Laterality Date   APPENDECTOMY     CESAREAN SECTION     KNEE SURGERY     TONSILLECTOMY     TYMPANOSTOMY TUBE PLACEMENT         Home Medications    Prior to Admission medications   Medication Sig Start Date End Date Taking? Authorizing Provider  ALPRAZolam (XANAX) 0.25 MG tablet Take 1 tablet (0.25 mg total) by mouth at bedtime as needed for anxiety or sleep. 11/25/18   Hali Marry, MD  atorvastatin (LIPITOR) 20 MG tablet Take 1 tablet (20 mg total) by mouth at bedtime. 04/22/18   Hali Marry, MD  cyanocobalamin (,VITAMIN B-12,) 1000 MCG/ML injection INJECT 1 ML (1,000 MCG TOTAL) INTO THE MUSCLE ONCE A WEEK. 12/23/18   Hali Marry, MD    guaiFENesin-codeine 100-10 MG/5ML syrup Take 57mL by mouth at bedtime as needed for cough. 01/05/19   Kandra Nicolas, MD  LO LOESTRIN FE 1 MG-10 MCG / 10 MCG tablet Take 1 tablet by mouth daily. 02/23/18   [provider]  predniSONE (DELTASONE) 20 MG tablet Take one tab by mouth twice daily for 4 days, then one daily. Take with food. 01/05/19   Kandra Nicolas, MD  sertraline (ZOLOFT) 100 MG tablet Take 1.5 tablets (150 mg total) by mouth daily. 11/25/18   Hali Marry, MD    Family History Family History  Problem Relation Age of Onset   Skin cancer Mother    Depression Mother    CAD Father 82       triple bypass    Hypertension Father    Skin cancer Father    Diabetes Father    Polycystic ovary syndrome Sister    Depression Sister     Social History Social History   Tobacco Use   Smoking status: Never Smoker   Smokeless tobacco: Never Used  Substance Use Topics   Alcohol use: Yes    Comment: rarely   Drug use: Never     Allergies   Patient has no known allergies.   Review of Systems Review of Systems No sore throat + cough No pleuritic pain, but feels tight in anterior chest No wheezing + nasal congestion ?  post-nasal drainage No sinus pain/pressure No itchy/red eyes No earache No hemoptysis + SOB with activity No fever, + night sweats No nausea No vomiting No abdominal pain + mild diarrhea for two weeks No urinary symptoms No skin rash + fatigue No myalgias No headache    Physical Exam Triage Vital Signs ED Triage Vitals  Enc Vitals Group     BP 01/05/19 1534 126/80     Pulse Rate 01/05/19 1534 71     Resp 01/05/19 1534 18     Temp 01/05/19 1534 98.7 F (37.1 C)     Temp Source 01/05/19 1534 Oral     SpO2 01/05/19 1534 100 %     Weight 01/05/19 1535 165 lb (74.8 kg)     Height 01/05/19 1535 5\' 7"  (1.702 m)     Head Circumference --      Peak Flow --      Pain Score 01/05/19 1534 0     Pain Loc --       Pain Edu? --      Excl. in GC? --    No data found.  Updated Vital Signs BP 126/80 (BP Location: Right Arm)    Pulse 71    Temp 98.7 F (37.1 C) (Oral)    Resp 18    Ht 5\' 7"  (1.702 m)    Wt 74.8 kg    LMP 12/13/2018    SpO2 100%    BMI 25.84 kg/m   Visual Acuity Right Eye Distance:   Left Eye Distance:   Bilateral Distance:    Right Eye Near:   Left Eye Near:    Bilateral Near:     Physical Exam Constitutional:      General: She is not in acute distress. HENT:     Head: Normocephalic.     Right Ear: Tympanic membrane normal.     Left Ear: Tympanic membrane normal.     Nose: Nose normal.     Mouth/Throat:     Pharynx: Oropharynx is clear.  Eyes:     Pupils: Pupils are equal, round, and reactive to light.  Neck:     Musculoskeletal: Neck supple.  Cardiovascular:     Rate and Rhythm: Normal rate.     Heart sounds: Normal heart sounds.  Pulmonary:     Effort: Pulmonary effort is normal.     Breath sounds: Normal breath sounds.  Chest:       Comments: Chest:  Distinct tenderness to palpation over the mid-sternum.  Palpation there recreates her discomfort. Abdominal:     Palpations: Abdomen is soft.     Tenderness: There is no abdominal tenderness.  Musculoskeletal:        General: Tenderness present.     Right lower leg: No edema.     Left lower leg: Edema present.  Lymphadenopathy:     Cervical: No cervical adenopathy.  Skin:    General: Skin is warm and dry.  Neurological:     Mental Status: She is alert.      UC Treatments / Results  Labs (all labs ordered are listed, but only abnormal results are displayed) Labs Reviewed  POC SARS CORONAVIRUS 2 ED negative    EKG   Radiology Dg Chest 2 View  Result Date: 01/05/2019 CLINICAL DATA:  Shortness of breath and cough for 1 month, possible COVID-19 exposure EXAM: CHEST - 2 VIEW COMPARISON:  None. FINDINGS: The heart size and mediastinal contours are within normal limits. Both lungs are clear.  The  visualized skeletal structures are unremarkable. IMPRESSION: No active cardiopulmonary disease. Electronically Signed   By: Alcide CleverMark  Lukens M.D.   On: 01/05/2019 17:24    Procedures Procedures (including critical care time)  Medications Ordered in UC Medications - No data to display  Initial Impression / Assessment and Plan / UC Course  I have reviewed the triage vital signs and the nursing notes.  Pertinent labs & imaging results that were available during my care of the patient were reviewed by me and considered in my medical decision making (see chart for details).    Negative chest x-ray reassuring. Begin prednisone burst/taper. Rx for Robitussin AC for night time cough.  Controlled Substance Prescriptions I have consulted the [place] Controlled Substances Registry for this patient, and feel the risk/benefit ratio today is favorable for proceeding with this prescription for a controlled substance.   Followup with Family Doctor if not improved in about 10 to 14 days.   Final Clinical Impressions(s) / UC Diagnoses   Final diagnoses:  Shortness of breath  Costochondritis  Persistent cough for 3 weeks or longer     Discharge Instructions     Apply ice pack for 20 to 30 minutes, 3 to 4 times daily  Continue until pain decreases.  May take Tylenol daytime if needed for pain. After finishing prednisone, may switch to Ibuprofen 200mg , 4 tabs every 8 hours with food, if needed.    ED Prescriptions    Medication Sig Dispense Auth. Provider   predniSONE (DELTASONE) 20 MG tablet Take one tab by mouth twice daily for 4 days, then one daily. Take with food. 12 tablet Lattie HawBeese, Lenna Hagarty A, MD   guaiFENesin-codeine 100-10 MG/5ML syrup Take 10mL by mouth at bedtime as needed for cough. 50 mL Lattie HawBeese, Doloros Kwolek A, MD        Lattie HawBeese, Jaquelyne Firkus A, MD 01/06/19 [phone]

## 2019-01-22 LAB — POC SARS CORONAVIRUS 2 AG -  ED: SARS Coronavirus 2 Ag: NEGATIVE

## 2019-01-27 ENCOUNTER — Other Ambulatory Visit: Payer: Self-pay | Admitting: Family Medicine

## 2019-03-27 ENCOUNTER — Ambulatory Visit: Payer: BC Managed Care – PPO | Admitting: Family Medicine

## 2019-03-31 ENCOUNTER — Ambulatory Visit (INDEPENDENT_AMBULATORY_CARE_PROVIDER_SITE_OTHER): Payer: BC Managed Care – PPO | Admitting: Family Medicine

## 2019-03-31 ENCOUNTER — Encounter: Payer: Self-pay | Admitting: Family Medicine

## 2019-03-31 ENCOUNTER — Other Ambulatory Visit: Payer: Self-pay

## 2019-03-31 VITALS — BP 121/68 | HR 90 | Ht 67.0 in | Wt 177.0 lb

## 2019-03-31 DIAGNOSIS — M25561 Pain in right knee: Secondary | ICD-10-CM | POA: Diagnosis not present

## 2019-03-31 MED ORDER — MELOXICAM 15 MG PO TABS: 15.0000 mg | ORAL_TABLET | Freq: Every day | ORAL | 0 refills | Status: DC | PRN

## 2019-03-31 NOTE — Progress Notes (Signed)
Acute Office Visit  Subjective:    Patient ID: Denise Huff, female    DOB: [birth year], 39 y.o.   MRN: [id number]  Chief Complaint  Patient presents with  . Knee Pain   HPI Patient is in today for right knee pain that been going on for about 2 weeks.  She says that she is getting a sharp popping sensation in the knee with certain motions particularly with lifting the need to drive or if she is had the knee bent for about 10 minutes and then tries to straighten it.  She says is not bothersome with walking or just standing.  She says when it pops it is very painful.  It is not dislocating or locking.  She says this is reminiscent of some symptoms that she had on her left knee which she is already had 2 surgeries on.  She is been trying ibuprofen, aspirin, Tylenol and even one of her husbands naproxen and it does not provide a lot of relief but it does help minimally.  She reports occasional swelling but not very often.  History reviewed. No pertinent past medical history.  Past Surgical History:  Procedure Laterality Date  . APPENDECTOMY    . CESAREAN SECTION    . KNEE SURGERY    . TONSILLECTOMY    . TYMPANOSTOMY TUBE PLACEMENT      Family History  Problem Relation Age of Onset  . Skin cancer Mother   . Depression Mother   . CAD Father 80       triple bypass   . Hypertension Father   . Skin cancer Father   . Diabetes Father   . Polycystic ovary syndrome Sister   . Depression Sister     Social History   Socioeconomic History  . Marital status: Married    Spouse name: Waunita Schooner  . Number of children: 2  . Years of education: College   . Highest education level: Not on file  Occupational History  . Occupation: Pharmacist, hospital    Comment: Middle sCHOOL  Tobacco Use  . Smoking status: Never Smoker  . Smokeless tobacco: Never Used  Substance and Sexual Activity  . Alcohol use: Yes    Comment: rarely  . Drug use: Never  . Sexual activity: Yes    Partners: Male    Birth  control/protection: Condom  Other Topics Concern  . Not on file  Social History Narrative  . Not on file   Social Determinants of Health   Financial Resource Strain:   . Difficulty of Paying Living Expenses: Not on file  Food Insecurity:   . Worried About Charity fundraiser in the Last Year: Not on file  . Ran Out of Food in the Last Year: Not on file  Transportation Needs:   . Lack of Transportation (Medical): Not on file  . Lack of Transportation (Non-Medical): Not on file  Physical Activity:   . Days of Exercise per Week: Not on file  . Minutes of Exercise per Session: Not on file  Stress:   . Feeling of Stress : Not on file  Social Connections:   . Frequency of Communication with Friends and Family: Not on file  . Frequency of Social Gatherings with Friends and Family: Not on file  . Attends Religious Services: Not on file  . Active Member of Clubs or Organizations: Not on file  . Attends Archivist Meetings: Not on file  . Marital Status: Not on file  Intimate  Partner Violence:   . Fear of Current or Ex-Partner: Not on file  . Emotionally Abused: Not on file  . Physically Abused: Not on file  . Sexually Abused: Not on file    Outpatient Medications Prior to Visit  Medication Sig Dispense Refill  . ALPRAZolam (XANAX) 0.25 MG tablet Take 1 tablet (0.25 mg total) by mouth at bedtime as needed for anxiety or sleep. 30 tablet 1  . atorvastatin (LIPITOR) 20 MG tablet Take 1 tablet (20 mg total) by mouth at bedtime. 90 tablet 3  . cyanocobalamin (,VITAMIN B-12,) 1000 MCG/ML injection INJECT 1 ML (1,000 MCG TOTAL) INTO THE MUSCLE ONCE A WEEK. 12 mL 1  . LO LOESTRIN FE 1 MG-10 MCG / 10 MCG tablet Take 1 tablet by mouth daily.    . sertraline (ZOLOFT) 100 MG tablet TAKE 1.5 TABLETS BY MOUTH DAILY 135 tablet 1  . guaiFENesin-codeine 100-10 MG/5ML syrup Take 26mL by mouth at bedtime as needed for cough. 50 mL 0  . predniSONE (DELTASONE) 20 MG tablet Take one tab by  mouth twice daily for 4 days, then one daily. Take with food. 12 tablet 0   No facility-administered medications prior to visit.    No Known Allergies  Review of Systems     Objective:    Physical Exam Vitals reviewed.  Constitutional:      Appearance: She is well-developed.  HENT:     Head: Normocephalic and atraumatic.  Eyes:     Conjunctiva/sclera: Conjunctivae normal.  Cardiovascular:     Rate and Rhythm: Normal rate.  Pulmonary:     Effort: Pulmonary effort is normal.  Musculoskeletal:     Comments: Left knee with normal flexion and extension.  No increased laxity with anterior drawer test.  Tender along the lateral joint line and just above the patella.  She actually has little bruise off to the side as well just above the superior lateral edge of the patella which is also very tender.  No increased laxity of the patella.  No significant pain or tenderness with valgus or varus stress.  Negative McMurray's.  No significant swelling on exam today.  She has a very distinct popping with flexion and extension with significant moving of the patella.  Skin:    General: Skin is dry.     Coloration: Skin is not pale.  Neurological:     Mental Status: She is alert and oriented to person, place, and time.  Psychiatric:        Behavior: Behavior normal.     BP 121/68   Pulse 90   Ht 5\' 7"  (1.702 m)   Wt 177 lb (80.3 kg)   SpO2 100%   BMI 27.72 kg/m  Wt Readings from Last 3 Encounters:  03/31/19 177 lb (80.3 kg)  01/05/19 165 lb (74.8 kg)  11/25/18 165 lb (74.8 kg)    Health Maintenance Due  Topic Date Due  . PAP SMEAR-Modifier  08/05/2018       Assessment & Plan:   Problem List Items Addressed This Visit    None    Visit Diagnoses    Acute pain of right knee    -  Primary   Relevant Orders   DG Knee Complete 4 Views Right      Left knee pain-with painful popping.  Concerned that there may be a significant abnormality behind the patella that is causing it  to click and rub over the femoral head.  Without any specific injury fracture  etc. is low risk.  No significant increased laxity said to suggest ACL tear etc.  Will go ahead and get x-rays today.  Recommend a trial of Mobic since most of the over-the-counter's do not seem to be working very well.  Depending on results consider further work-up with physical therapy.  Patient prefers to go to [facility] (?) where she has received previous PT on her opposite knee.  And consider further work-up with MRI.  Meds ordered this encounter  Medications  . meloxicam (MOBIC) 15 MG tablet    Sig: Take 1 tablet (15 mg total) by mouth daily as needed for pain.    Dispense:  30 tablet    Refill:  0     Nani Gasser, MD

## 2019-04-01 ENCOUNTER — Ambulatory Visit (INDEPENDENT_AMBULATORY_CARE_PROVIDER_SITE_OTHER): Payer: BC Managed Care – PPO

## 2019-04-01 ENCOUNTER — Encounter: Payer: Self-pay | Admitting: Family Medicine

## 2019-04-01 DIAGNOSIS — M25561 Pain in right knee: Secondary | ICD-10-CM

## 2019-04-01 IMAGING — DX DG KNEE COMPLETE 4+V*R*
4 series · 4 of 4 positions shown · non-contrast
Comparison: None.

CLINICAL DATA: Right knee pain for 2 weeks, no known injury,
initial encounter

EXAM:
RIGHT KNEE - COMPLETE 4+ VIEW

[knee ap]
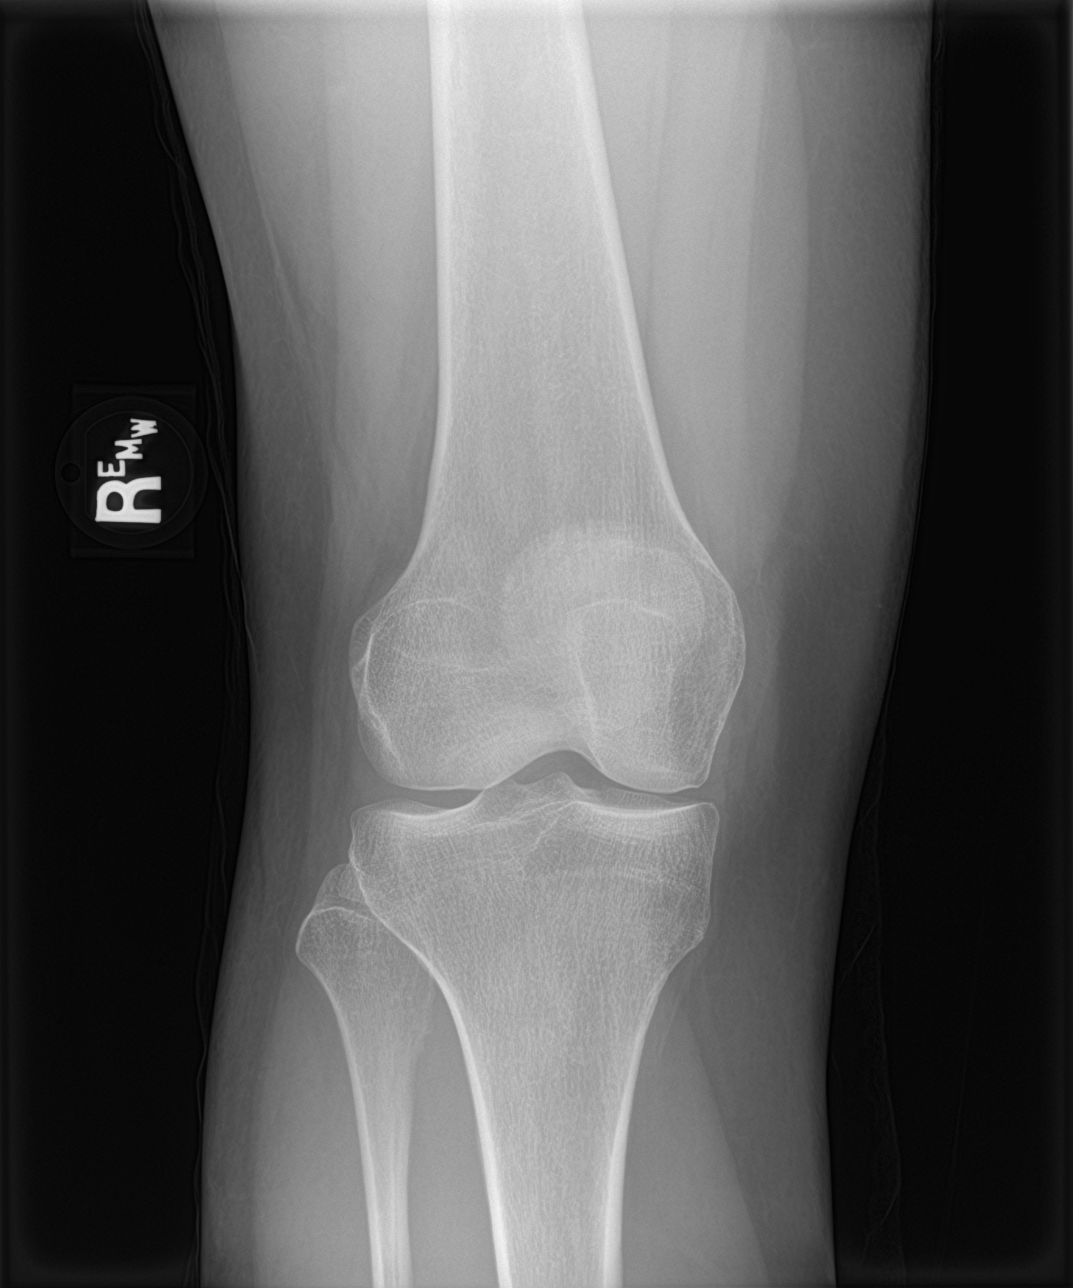

[knee lat]
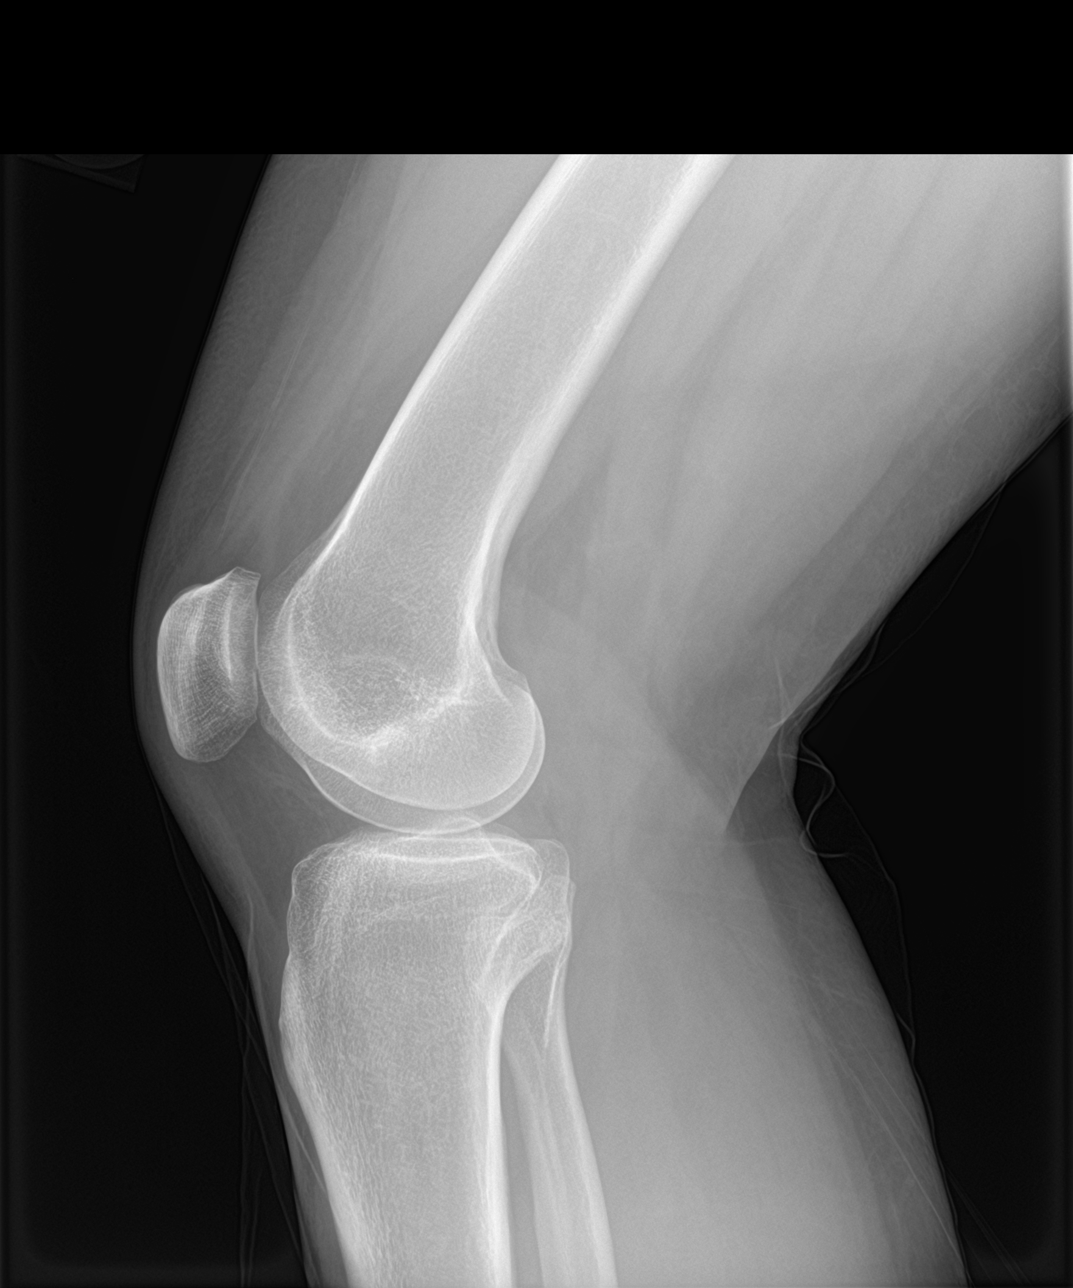

[knee obl (1 of 2)]
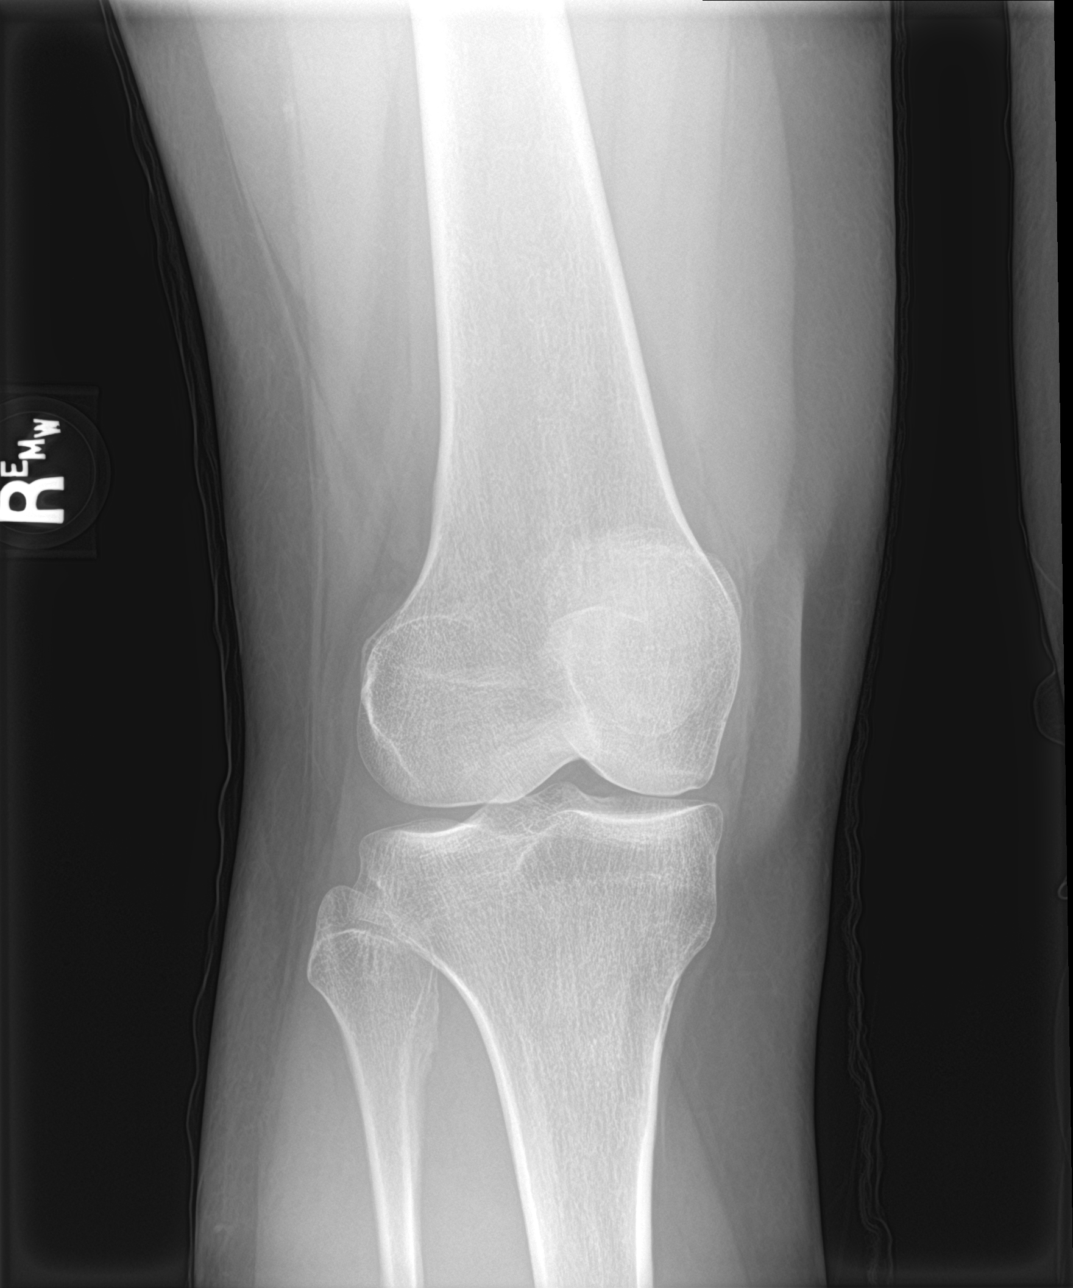

[knee obl (2 of 2)]
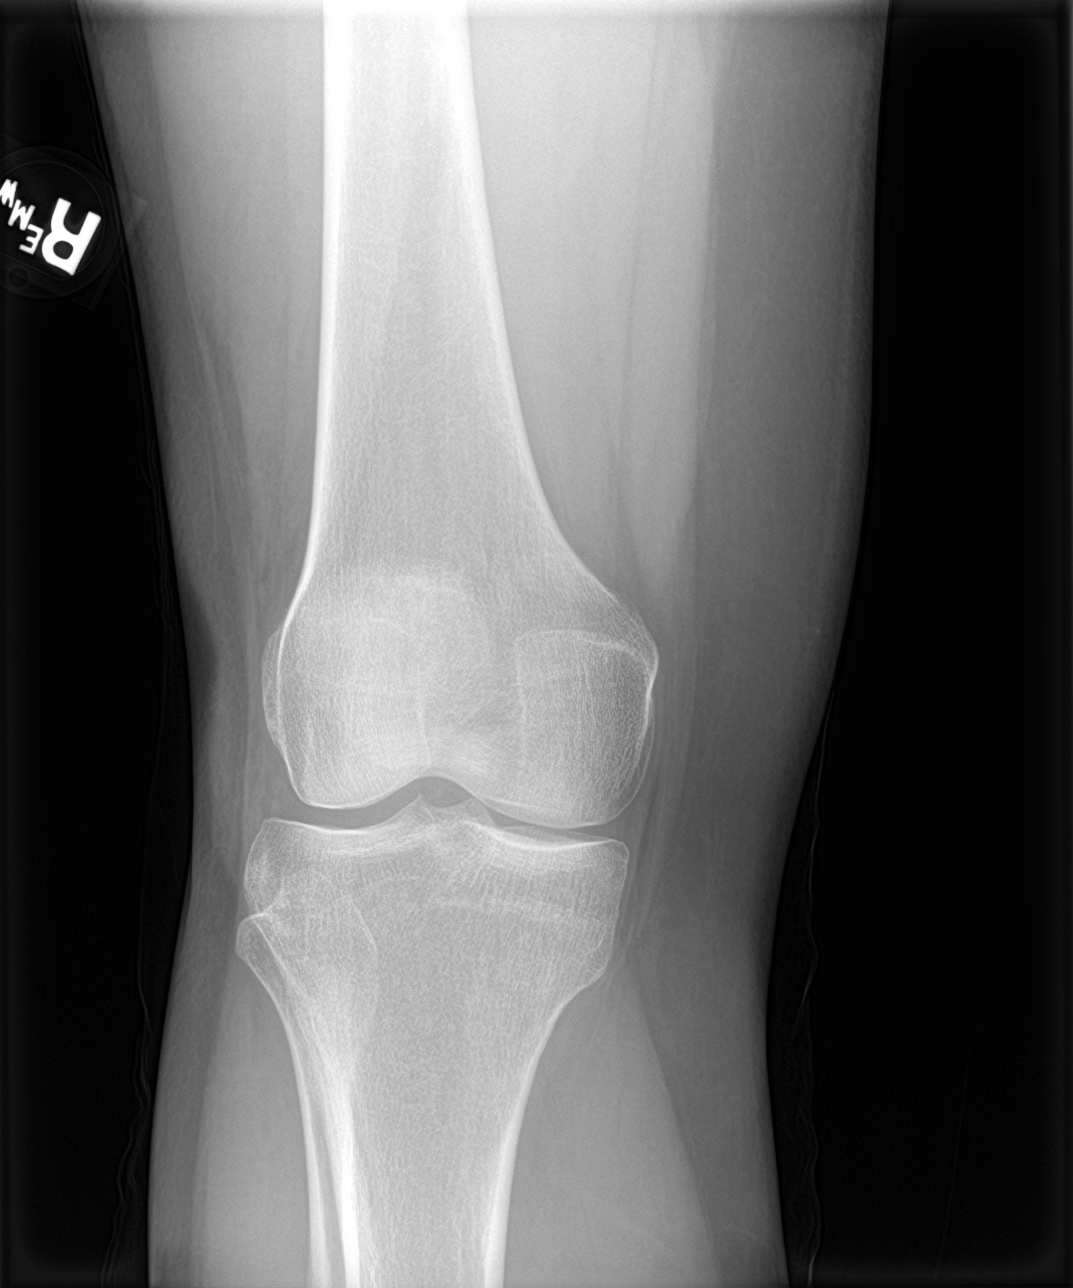

[4 of 4 positions shown; findings below may reference images not displayed]

FINDINGS: No evidence of fracture, dislocation, or joint effusion. No evidence
of arthropathy or other focal bone abnormality. Soft tissues are
unremarkable.
IMPRESSION: No acute abnormality noted.

## 2019-04-03 ENCOUNTER — Ambulatory Visit: Payer: BC Managed Care – PPO | Admitting: Family Medicine

## 2019-04-05 ENCOUNTER — Other Ambulatory Visit: Payer: Self-pay | Admitting: Family Medicine

## 2019-04-08 DIAGNOSIS — M25561 Pain in right knee: Secondary | ICD-10-CM | POA: Diagnosis not present

## 2019-04-08 DIAGNOSIS — M6281 Muscle weakness (generalized): Secondary | ICD-10-CM | POA: Diagnosis not present

## 2019-04-13 ENCOUNTER — Encounter: Payer: Self-pay | Admitting: Family Medicine

## 2019-04-13 DIAGNOSIS — M6281 Muscle weakness (generalized): Secondary | ICD-10-CM | POA: Diagnosis not present

## 2019-04-13 DIAGNOSIS — M25561 Pain in right knee: Secondary | ICD-10-CM | POA: Diagnosis not present

## 2019-04-13 NOTE — Telephone Encounter (Signed)
OK, order placed

## 2019-04-13 NOTE — Telephone Encounter (Signed)
Since she has had x-rays recently and has tried and failed physical therapy and oral medications, I feel confident I could get an MRI approved at this point.

## 2019-04-15 DIAGNOSIS — M25561 Pain in right knee: Secondary | ICD-10-CM | POA: Diagnosis not present

## 2019-04-15 DIAGNOSIS — M6281 Muscle weakness (generalized): Secondary | ICD-10-CM | POA: Diagnosis not present

## 2019-04-18 ENCOUNTER — Ambulatory Visit (INDEPENDENT_AMBULATORY_CARE_PROVIDER_SITE_OTHER): Payer: BC Managed Care – PPO

## 2019-04-18 ENCOUNTER — Other Ambulatory Visit: Payer: Self-pay

## 2019-04-18 DIAGNOSIS — M25561 Pain in right knee: Secondary | ICD-10-CM | POA: Diagnosis not present

## 2019-04-18 IMAGING — MR MR KNEE*R* W/O CM
7 series · 40 of 40 positions shown · non-contrast
Comparison: None.

CLINICAL DATA: Right knee pain for 1 month.  Popping and clicking.

EXAM:
MRI OF THE RIGHT KNEE WITHOUT CONTRAST
TECHNIQUE: Multiplanar, multisequence MR imaging of the knee was performed. No
intravenous contrast was administered.

[Series 3: T2 fat-sat · axial · 4.0mm · 0.53mm/px · z∈[-80,+90]mm · 6 of 35 slices shown (1 of 3)]
[im 1/35]
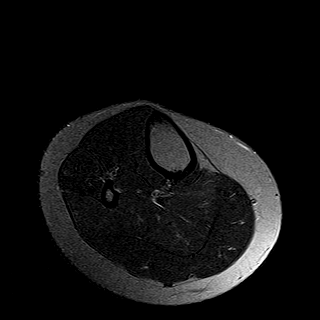
[im 7/35]
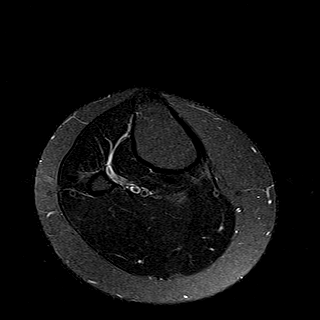
[im 14/35]
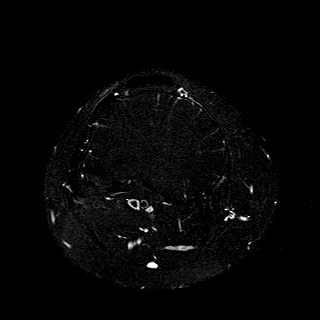
[im 21/35]
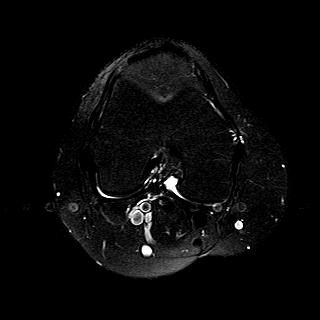
[im 28/35]
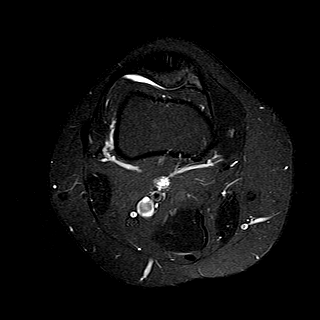
[im 35/35]
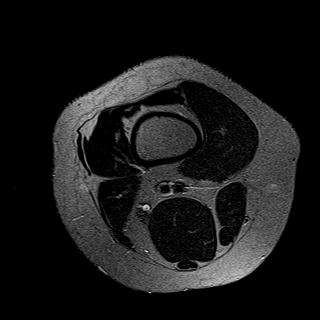

[Series 4: T1 · coronal · 4.0mm · 0.62mm/px · 5 of 30 slices shown]
[im 1/30]
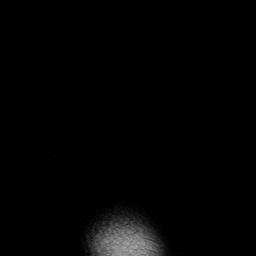
[im 8/30]
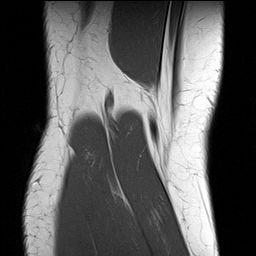
[im 15/30]
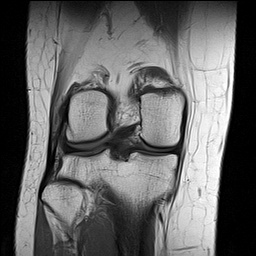
[im 22/30]
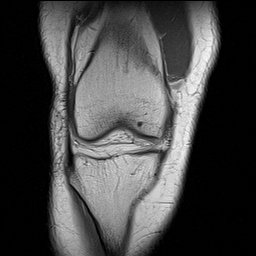
[im 30/30]
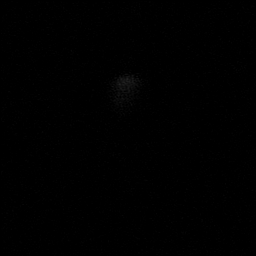

[Series 5: T2 fat-sat · coronal · 4.0mm · 0.62mm/px · 5 of 30 slices shown (2 of 3)]
[im 1/30]
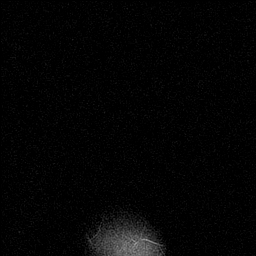
[im 8/30]
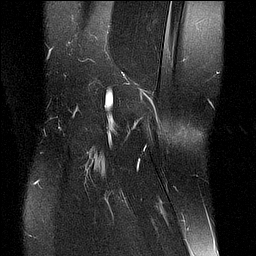
[im 15/30]
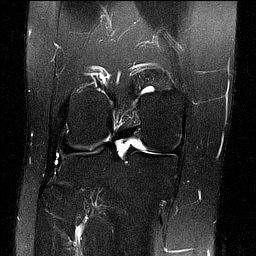
[im 22/30]
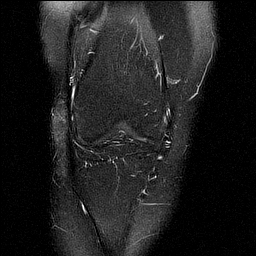
[im 30/30]
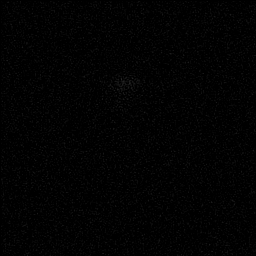

[Series 6: PD fat-sat · coronal · 4.0mm · 0.62mm/px · 6 of 30 slices shown (1 of 3)]
[im 1/30]
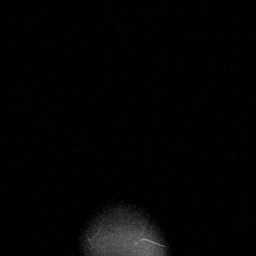
[im 6/30]
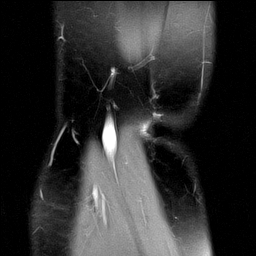
[im 12/30]
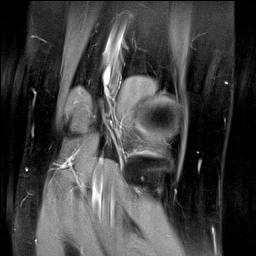
[im 18/30]
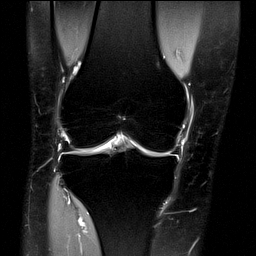
[im 24/30]
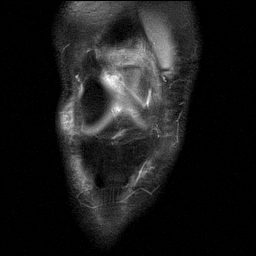
[im 30/30]
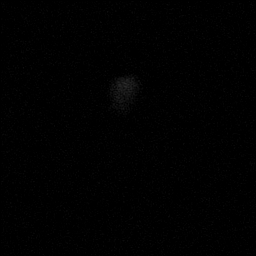

[Series 7: PD fat-sat · sagittal · 3.0mm · 0.62mm/px · 7 of 35 slices shown (2 of 3)]
[im 1/35]
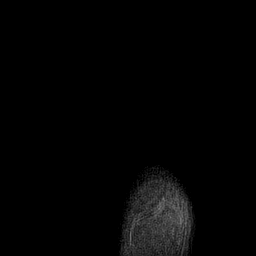
[im 6/35]
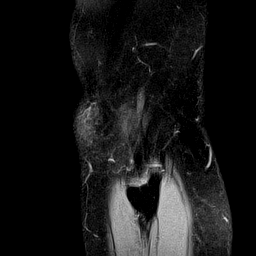
[im 12/35]
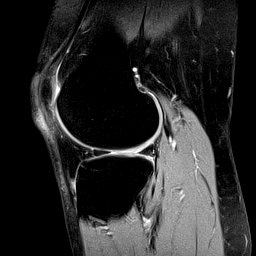
[im 18/35]
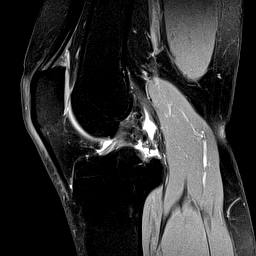
[im 23/35]
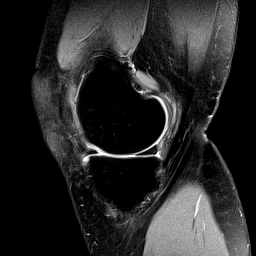
[im 29/35]
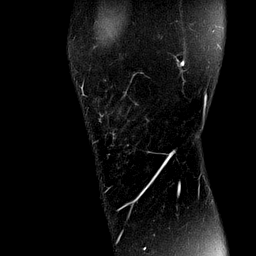
[im 35/35]
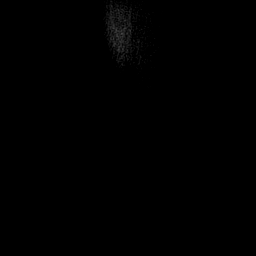

[Series 8: T2 fat-sat · sagittal · 3.0mm · 0.62mm/px · 7 of 35 slices shown (3 of 3)]
[im 1/35]
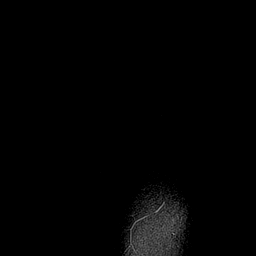
[im 6/35]
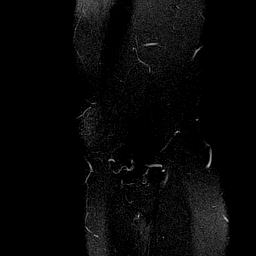
[im 12/35]
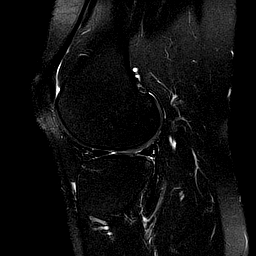
[im 18/35]
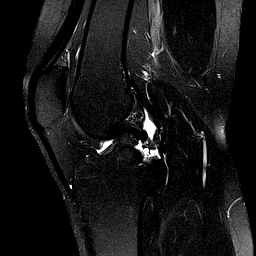
[im 23/35]
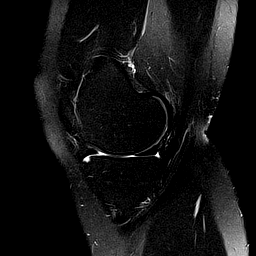
[im 29/35]
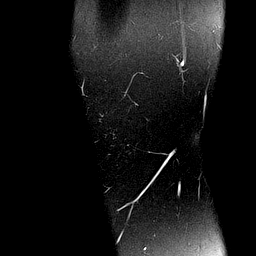
[im 35/35]
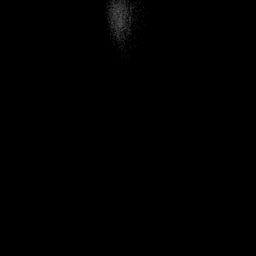

[Series 9: PD fat-sat · coronal · 2.0mm · 0.62mm/px · 4 of 19 slices shown (3 of 3)]
[im 1/19]
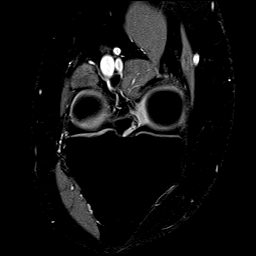
[im 7/19]
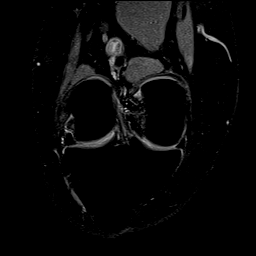
[im 13/19]
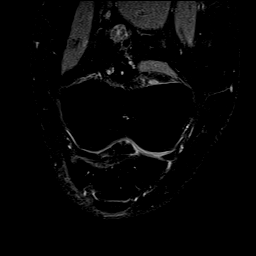
[im 19/19]
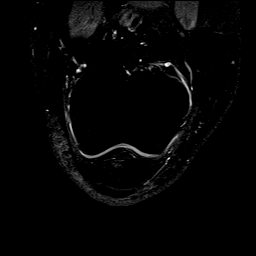

[40 of 40 positions shown; findings below may reference images not displayed]

FINDINGS: MENISCI

Medial meniscus:  Intact.

Lateral meniscus:  Intact.

LIGAMENTS

Cruciates:  Intact ACL and PCL.

Collaterals: Medial collateral ligament is intact. Lateral
collateral ligament complex is intact.

CARTILAGE

Patellofemoral:  No chondral defect.

Medial: Mild partial-thickness cartilage loss of the medial
femorotibial compartment.

Lateral:  No chondral defect.

Joint: No significant joint effusion. Normal Hoffa's fat. No plical
thickening.

Popliteal Fossa:  No Baker cyst. Intact popliteus tendon.

Extensor Mechanism: Intact quadriceps tendon. Intact patellar
tendon. Intact medial patellar retinaculum. Intact lateral patellar
retinaculum. Intact MPFL.

Bones:  No acute osseous abnormality.  No aggressive osseous lesion.

Other: Muscles are normal. No muscle atrophy. No fluid collection or
hematoma.
IMPRESSION: 1. No meniscal or ligamentous injury of the right knee.
2. Mild partial-thickness cartilage loss of the medial femorotibial
compartment.

## 2019-04-20 ENCOUNTER — Encounter: Payer: Self-pay | Admitting: Family Medicine

## 2019-04-20 DIAGNOSIS — M25561 Pain in right knee: Secondary | ICD-10-CM

## 2019-04-22 DIAGNOSIS — M6281 Muscle weakness (generalized): Secondary | ICD-10-CM | POA: Diagnosis not present

## 2019-04-22 DIAGNOSIS — M25561 Pain in right knee: Secondary | ICD-10-CM | POA: Diagnosis not present

## 2019-04-22 NOTE — Telephone Encounter (Signed)
REf placed.  

## 2019-04-29 ENCOUNTER — Ambulatory Visit: Payer: BC Managed Care – PPO | Admitting: Orthopaedic Surgery

## 2019-04-29 ENCOUNTER — Other Ambulatory Visit: Payer: Self-pay

## 2019-04-29 ENCOUNTER — Telehealth: Payer: Self-pay

## 2019-04-29 ENCOUNTER — Encounter: Payer: Self-pay | Admitting: Orthopaedic Surgery

## 2019-04-29 VITALS — Ht 68.31 in | Wt 175.4 lb

## 2019-04-29 DIAGNOSIS — M25561 Pain in right knee: Secondary | ICD-10-CM

## 2019-04-29 DIAGNOSIS — G8929 Other chronic pain: Secondary | ICD-10-CM

## 2019-04-29 MED ORDER — LIDOCAINE HCL 1 % IJ SOLN
3.0000 mL | INTRAMUSCULAR | Status: AC | PRN
  Administered 2019-04-29: 3 mL

## 2019-04-29 MED ORDER — METHYLPREDNISOLONE ACETATE 40 MG/ML IJ SUSP
40.0000 mg | INTRAMUSCULAR | Status: AC | PRN
  Administered 2019-04-29: 40 mg via INTRA_ARTICULAR

## 2019-04-29 NOTE — Telephone Encounter (Signed)
Right knee gel injection  

## 2019-04-29 NOTE — Progress Notes (Signed)
Office Visit Note   Patient: Denise Huff           Date of Birth: Jun 27, 1980           MRN: [id number] Visit Date: 04/29/2019              Requested by: Hali Marry, [address] [place],  [place] [id number] PCP: Hali Marry, MD   Assessment & Plan: Visit Diagnoses:  1. Chronic pain of right knee     Plan: We had a long and thorough discussion about her knee.  I do feel that the most appropriate step is conservative treatment at first which would include a steroid injection today and hopefully hyaluronic acid injection to treat the pain from her osteoarthritis in about 4 weeks from now.  I gave her handout to consider a Synvisc 1 injection in her right knee from 4 weeks from now.  I did place a steroid injection in her right knee today to see how she does clinically.  All question concerns were answered addressed.  She agrees with this conservative treatment plan for now.  Follow-Up Instructions: Return in about 4 weeks (around 05/27/2019).   Orders:  Orders Placed This Encounter  Procedures  . Large Joint Inj: R knee   No orders of the defined types were placed in this encounter.     Procedures: Large Joint Inj: R knee on 04/29/2019 10:34 AM Indications: diagnostic evaluation and pain Details: 22 G 1.5 in needle, superolateral approach  Arthrogram: No  Medications: 3 mL lidocaine 1 %; 40 mg methylPREDNISolone acetate 40 MG/ML Outcome: tolerated well, no immediate complications Procedure, treatment alternatives, risks and benefits explained, specific risks discussed. Consent was given by the patient. Immediately prior to procedure a time out was called to verify the correct patient, procedure, equipment, support staff and site/side marked as required. Patient was prepped and draped in the usual sterile fashion.       Clinical Data: No additional findings.   Subjective: Chief Complaint  Patient presents with  . Right Knee  - Pain  The patient is a very pleasant and active 39 year old who comes in for evaluation treatment of right knee pain.  She has had a recent MRI of her right knee.  We have that for our review on the canopy system.  She is never injured that knee that she is aware of and has not had any surgery on the right knee.  She has never had an injection in it either.  She does have a complicated history of her left knee that has included 2 arthroscopic interventions to that knee.  This was the knee that also had minimal findings on a MRI that ended up needing quite extensive surgery.  This included also a lateral release and a partial meniscectomy from what I understand from the notes.  I understand why she is concerned about her right knee given what is happened to her left knee.  Her left knee does have a lot of popping and clicking and grinding.  She points to the superior patella area as a source of a lot of her pain.  She has been through physical therapy as well for that knee.  It is becoming to the point where the pain is detriment affecting her mobility, her quality of life and her actives of daily living.  She has no other significant active medical problems.  She is not a diabetic and not a smoker.  HPI  Review of Systems She currently denies any headache, chest pain, shortness of breath, fever, chills, nausea, vomiting  Objective: Vital Signs: Ht 5' 8.31" (1.735 m)   Wt 175 lb 6.4 oz (79.6 kg)   BMI 26.43 kg/m   Physical Exam She is alert and orient x3 and in no acute distress Ortho Exam Examination of her right knee shows no guarding.  There is no effusion.  She does have a lot of pain with flexion extension of the knee and it is definitely painful to put her through an arc of motion.  I can feel crepitation of the patellofemoral joint as well.  She does have global tenderness in general but the knee feels ligamentously stable. Specialty Comments:  No specialty comments available.  Imaging:  No results found. We did review the MRI of her right knee.  There is no evidence of meniscal tear.  There is some mild thinning of the medial compartment of the knee in terms of arthritis but no evidence of subchondral edema and there is no evidence of cruciate or collateral ligament tears or injuries.  PMFS History: Patient Active Problem List   Diagnosis Date Noted  . Skin tag 05/19/2018  . Patella-femoral syndrome 10/28/2017  . Mixed hyperlipidemia 10/28/2017  . Depression with anxiety 10/28/2017  . Pernicious anemia 11/27/2016  . Atrophy of muscle of left thigh 11/27/2016  . Venous insufficiency of left leg 11/27/2016  . Sore throat, chronic 12/03/2013  . S/P laparoscopic appendectomy 11/01/2010   History reviewed. No pertinent past medical history.  Family History  Problem Relation Age of Onset  . Skin cancer Mother   . Depression Mother   . CAD Father 93       triple bypass   . Hypertension Father   . Skin cancer Father   . Diabetes Father   . Polycystic ovary syndrome Sister   . Depression Sister     Past Surgical History:  Procedure Laterality Date  . APPENDECTOMY    . CESAREAN SECTION    . KNEE SURGERY    . TONSILLECTOMY    . TYMPANOSTOMY TUBE PLACEMENT     Social History   Occupational History  . Occupation: Runner, broadcasting/film/video    Comment: Middle sCHOOL  Tobacco Use  . Smoking status: Never Smoker  . Smokeless tobacco: Never Used  Substance and Sexual Activity  . Alcohol use: Yes    Comment: rarely  . Drug use: Never  . Sexual activity: Yes    Partners: Male    Birth control/protection: Condom

## 2019-04-30 NOTE — Telephone Encounter (Signed)
Does pt have OA? Only dx is see is right knee pain. Please advise

## 2019-04-30 NOTE — Telephone Encounter (Signed)
OA is mentioned in office note.

## 2019-05-01 ENCOUNTER — Telehealth: Payer: Self-pay

## 2019-05-01 ENCOUNTER — Telehealth: Payer: Self-pay | Admitting: Orthopaedic Surgery

## 2019-05-01 NOTE — Telephone Encounter (Signed)
Please see below and advise.

## 2019-05-01 NOTE — Telephone Encounter (Signed)
Since we just did the steroid in her knee, we will see how she does with that and see her in the office in follow-up.

## 2019-05-01 NOTE — Telephone Encounter (Signed)
Pt informed and stated understanding. She didn't want to proceed with self pay for injection. Please advise of next step

## 2019-05-01 NOTE — Telephone Encounter (Signed)
Pt has [employer]. Per [employer]- As of 01/20/16, HA injections are no longer considered medically necessary and will no longer be covered.

## 2019-05-01 NOTE — Telephone Encounter (Signed)
I called patient and advised. She is not going to be able to afford the gel injection and states that she is not going to try it. She will follow up in four weeks post cortisone injection.

## 2019-05-01 NOTE — Telephone Encounter (Signed)
Submitted for VOB for Durolane-Right knee 

## 2019-05-01 NOTE — Telephone Encounter (Signed)
Patient called requesting call back from Autumn H. Patient states Autumn left message and only needs to speak with her. Patient phone number is [phone].

## 2019-05-01 NOTE — Telephone Encounter (Signed)
Lvm for pt to call back to inform 

## 2019-05-01 NOTE — Telephone Encounter (Signed)
Noted will call back

## 2019-05-23 ENCOUNTER — Other Ambulatory Visit: Payer: Self-pay | Admitting: Family Medicine

## 2019-05-27 ENCOUNTER — Ambulatory Visit: Payer: BC Managed Care – PPO | Admitting: Orthopaedic Surgery

## 2019-06-01 ENCOUNTER — Encounter: Payer: Self-pay | Admitting: Nurse Practitioner

## 2019-06-01 ENCOUNTER — Ambulatory Visit (INDEPENDENT_AMBULATORY_CARE_PROVIDER_SITE_OTHER): Payer: BC Managed Care – PPO | Admitting: Nurse Practitioner

## 2019-06-01 ENCOUNTER — Other Ambulatory Visit: Payer: Self-pay

## 2019-06-01 VITALS — BP 115/71 | HR 79 | Temp 98.3°F | Ht 67.0 in | Wt 173.0 lb

## 2019-06-01 DIAGNOSIS — R42 Dizziness and giddiness: Secondary | ICD-10-CM | POA: Diagnosis not present

## 2019-06-01 DIAGNOSIS — R519 Headache, unspecified: Secondary | ICD-10-CM

## 2019-06-01 MED ORDER — PREDNISONE 20 MG PO TABS: 20.0000 mg | ORAL_TABLET | Freq: Two times a day (BID) | ORAL | 0 refills | Status: DC

## 2019-06-01 MED ORDER — CLONAZEPAM 0.5 MG PO TABS: 0.5000 mg | ORAL_TABLET | Freq: Three times a day (TID) | ORAL | 0 refills | Status: DC | PRN

## 2019-06-01 NOTE — Progress Notes (Signed)
Acute Office Visit  Subjective:    Patient ID: Denise Huff, female    DOB: 27-Aug-1980, 39 y.o.   MRN: [id number]  Chief Complaint  Patient presents with  . Dizziness    onset:6d, extreme dizziness/vertigo, flew down on Monday and back on Thursday, has gotten worse since returning home, 5 episodes of dizziness today, swaying, feeling like she is going to fall over, no dizziness when driving or riding in the car, but will return when she gets out of the car, not bothersome when laying down, has tried meclizine, dramamine, benadryl, head tilt techniques with minimal benefit, has been monitoring sugar/aspartane intake    HPI Patient is in today for extreme symptoms of dizziness that started extreme symptoms of dizziness that started last Thursday.  On Wednesday patient flew to [place] and flew back on Wednesday.  When she returned home she began to recognize symptoms of vertigo.  She reports her symptoms as she feels as though she is moving back and forth or swaying in a circular motion as opposed to things moving around her.  She has had vertigo in the past and tried Epley's maneuver as well as Benadryl and Dramamine.  None were effective in helping her symptoms.  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  She also reports a feeling of a headache around the back of her head.  She reports the headache is not there constantly but the dizziness is.  Last night she did have a drink of alcohol and she said it actually improved her symptoms.  She is concerned because her balance and gait are off and she is afraid she is going to fall.  She denies any recent illness, virus, ear pain or pressure, sinus congestion, rhinorrhea, allergy symptoms.  History reviewed. No pertinent past medical history.  Past Surgical History:  Procedure Laterality Date  .  APPENDECTOMY    . CESAREAN SECTION    . KNEE SURGERY    . TONSILLECTOMY    . TYMPANOSTOMY TUBE PLACEMENT      Family History  Problem Relation Age of Onset  . Skin cancer Mother   . Depression Mother   . CAD Father 98       triple bypass   . Hypertension Father   . Skin cancer Father   . Diabetes Father   . Polycystic ovary syndrome Sister   . Depression Sister     Social History   Socioeconomic History  . Marital status: Married    Spouse name: Theodoro Grist  . Number of children: 2  . Years of education: College   . Highest education level: Not on file  Occupational History  . Occupation: Runner, broadcasting/film/video    Comment: Middle sCHOOL  Tobacco Use  . Smoking status: Never Smoker  . Smokeless tobacco: Never Used  Substance and Sexual Activity  . Alcohol use: Yes    Comment: rarely  . Drug use: Never  . Sexual activity: Yes    Partners: Male    Birth control/protection: Condom  Other Topics Concern  . Not on file  Social History Narrative  . Not on file   Social Determinants of Health   Financial Resource Strain:   . Difficulty of Paying Living Expenses:   Food Insecurity:   . Worried About Programme researcher, broadcasting/film/video in the Last  Year:   . Ran Out of Food in the Last Year:   Transportation Needs:   . Film/video editor (Medical):   Marland Kitchen Lack of Transportation (Non-Medical):   Physical Activity:   . Days of Exercise per Week:   . Minutes of Exercise per Session:   Stress:   . Feeling of Stress :   Social Connections:   . Frequency of Communication with Friends and Family:   . Frequency of Social Gatherings with Friends and Family:   . Attends Religious Services:   . Active Member of Clubs or Organizations:   . Attends Archivist Meetings:   Marland Kitchen Marital Status:   Intimate Partner Violence:   . Fear of Current or Ex-Partner:   . Emotionally Abused:   Marland Kitchen Physically Abused:   . Sexually Abused:     Outpatient Medications Prior to Visit  Medication Sig Dispense Refill   . ALPRAZolam (XANAX) 0.25 MG tablet Take 1 tablet (0.25 mg total) by mouth at bedtime as needed for anxiety or sleep. 30 tablet 1  . atorvastatin (LIPITOR) 20 MG tablet TAKE 1 TABLET BY MOUTH EVERYDAY AT BEDTIME 90 tablet 1  . cyanocobalamin (,VITAMIN B-12,) 1000 MCG/ML injection INJECT 1 ML (1,000 MCG TOTAL) INTO THE MUSCLE ONCE A WEEK. 12 mL 1  . LO LOESTRIN FE 1 MG-10 MCG / 10 MCG tablet Take 1 tablet by mouth daily.    . sertraline (ZOLOFT) 100 MG tablet TAKE 1.5 TABLETS BY MOUTH DAILY 135 tablet 1  . meloxicam (MOBIC) 15 MG tablet Take 1 tablet (15 mg total) by mouth daily as needed for pain. (Patient not taking: Reported on 06/01/2019) 30 tablet 0   No facility-administered medications prior to visit.    No Known Allergies  Review of Systems  Constitutional: Negative for chills, fatigue and fever.  HENT: Negative for congestion, ear discharge, ear pain, facial swelling, hearing loss, postnasal drip, rhinorrhea, sinus pressure, sinus pain, sore throat, tinnitus and trouble swallowing.   Eyes: Negative for photophobia and visual disturbance.  Respiratory: Negative for cough, chest tightness and shortness of breath.   Gastrointestinal: Positive for nausea. Negative for diarrhea and vomiting.  Neurological: Positive for dizziness, light-headedness and headaches. Negative for tremors, seizures, syncope, facial asymmetry, speech difficulty, weakness and numbness.  Psychiatric/Behavioral: Negative for confusion and sleep disturbance. The patient is not nervous/anxious.        Objective:    Physical Exam Constitutional:      Appearance: Normal appearance. She is normal weight.  HENT:     Head: Normocephalic.     Right Ear: Tympanic membrane normal.     Left Ear: Tympanic membrane normal.     Nose: Nose normal.  Eyes:     General: Lids are normal. No visual field deficit.    Extraocular Movements:     Right eye: Nystagmus present.     Left eye: Nystagmus present.      Conjunctiva/sclera: Conjunctivae normal.     Pupils: Pupils are equal, round, and reactive to light.  Cardiovascular:     Rate and Rhythm: Normal rate.  Pulmonary:     Effort: Pulmonary effort is normal.  Musculoskeletal:        General: Normal range of motion.     Cervical back: Normal range of motion and neck supple.  Skin:    General: Skin is warm and dry.     Capillary Refill: Capillary refill takes less than 2 seconds.  Neurological:     General: No  focal deficit present.     Mental Status: She is alert and oriented to person, place, and time.     Cranial Nerves: No cranial nerve deficit or facial asymmetry.     Sensory: No sensory deficit.     Motor: No weakness.     Coordination: Romberg sign negative. Coordination normal. Finger-Nose-Finger Test normal.     Gait: Gait normal.  Psychiatric:        Mood and Affect: Mood normal.        Behavior: Behavior normal.        Thought Content: Thought content normal.        Judgment: Judgment normal.     BP 115/71   Pulse 79   Temp 98.3 F (36.8 C) (Oral)   Ht 5\' 7"  (1.702 m)   Wt 173 lb (78.5 kg)   LMP 05/31/2019   SpO2 99%   BMI 27.10 kg/m  Wt Readings from Last 3 Encounters:  06/01/19 173 lb (78.5 kg)  04/29/19 175 lb 6.4 oz (79.6 kg)  03/31/19 177 lb (80.3 kg)    Health Maintenance Due  Topic Date Due  . PAP SMEAR-Modifier  08/05/2018    There are no preventive care reminders to display for this patient.   Lab Results  Component Value Date   TSH 0.65 04/03/2018   Lab Results  Component Value Date   WBC 5.5 04/03/2018   HGB 12.3 04/03/2018   HCT 36.0 04/03/2018   MCV 92.5 04/03/2018   PLT 294 04/03/2018   Lab Results  Component Value Date   NA 140 04/03/2018   K 4.2 04/03/2018   CO2 27 04/03/2018   GLUCOSE 89 04/03/2018   BUN 11 04/03/2018   CREATININE 0.83 04/03/2018   BILITOT 0.7 04/03/2018   AST 16 04/03/2018   ALT 10 04/03/2018   PROT 6.8 04/03/2018   CALCIUM 9.3 04/03/2018   Lab  Results  Component Value Date   CHOL 221 (H) 04/03/2018   Lab Results  Component Value Date   HDL 50 04/03/2018   Lab Results  Component Value Date   LDLCALC 140 (H) 04/03/2018   Lab Results  Component Value Date   TRIG 175 (H) 04/03/2018   Lab Results  Component Value Date   CHOLHDL 4.4 04/03/2018   Lab Results  Component Value Date   HGBA1C 5.2 04/03/2018       Assessment & Plan:   1. Vertigo Symptoms and presentation consistent with vertigo.  Differential diagnosis includes vestibular migraine based on patient's symptoms and inclusion of headache- unable to rule this out at this time. Will trial a prednisone burst for 5 days in addition to as needed clonazepam to help with symptoms.  Patient has already utilized a trial of antihistamines without success.  Patient provided with vestibular rehabilitation maneuvers to perform on her own. Patient to follow-up if symptoms persist or worsen. - clonazePAM (KLONOPIN) 0.5 MG tablet; Take 1 tablet (0.5 mg total) by mouth 3 (three) times daily as needed (Vertigo).  Dispense: 30 tablet; Refill: 0 - predniSONE (DELTASONE) 20 MG tablet; Take 1 tablet (20 mg total) by mouth 2 (two) times daily with a meal.  Dispense: 10 tablet; Refill: 0  2. Nonintractable episodic headache, unspecified headache type Headache in the presence of vertigo.  Differential diagnosis includes vestibular migraine based on patient's symptoms-unable to rule out at this time. Prednisone burst and clonazepam as needed for vertigo symptoms.  Instructed the patient that she may also trial sed rate  migraine for the headache to see if this helps with her symptoms. Patient provided with vestibular rehabilitation maneuvers to perform on her own. She is to follow-up if symptoms worsen or fail to improve.    Tollie Eth, NP

## 2019-06-01 NOTE — Patient Instructions (Signed)

## 2019-06-02 ENCOUNTER — Telehealth: Payer: Self-pay

## 2019-06-02 DIAGNOSIS — R519 Headache, unspecified: Secondary | ICD-10-CM | POA: Diagnosis not present

## 2019-06-02 DIAGNOSIS — R42 Dizziness and giddiness: Secondary | ICD-10-CM | POA: Diagnosis not present

## 2019-06-02 NOTE — Telephone Encounter (Signed)
Spoke with patient- vertigo persistent and worsening. She took first dose of prednisone (20mg ) last night and second dose of prednisone (20mg ) this morning with breakfast. 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 She was required to call the front office to come and help her due to inability to walk without assistance due to dizziness. She has been resting for approximately 2 hours in a reclined position which helps, but when she moves the vertigo returns and when she walks she "feels drunk". She is also having slower speech and difficulty with word recall. She reports also feeling cold and jittery. Plan for STAT MRI.   Discussed plan with the patient.

## 2019-06-02 NOTE — Telephone Encounter (Signed)
Denise Huff called and states she her symptoms have worsened. She reports the episodes of dizziness are lasting longer. About an hour an episode. She has confusion along with problems walking during the episode. Please advise.

## 2019-06-02 NOTE — Telephone Encounter (Signed)
Denise Huff has been advised to go to [facility] [address] [place] [place], [place] [address] at 3:30 pm. She has been instructed to pull up to the curbside for parking directions. Once parked she will need to call. [phone]  The results will be faxed but can be viewed on Care Everywhere once resulted.    MR Brain with and without contrast has been approved [id number] from 06/02/2019-07/01/2019.

## 2019-06-03 ENCOUNTER — Encounter: Payer: Self-pay | Admitting: Nurse Practitioner

## 2019-06-03 ENCOUNTER — Ambulatory Visit: Payer: BC Managed Care – PPO | Admitting: Orthopaedic Surgery

## 2019-06-03 ENCOUNTER — Other Ambulatory Visit: Payer: Self-pay | Admitting: Nurse Practitioner

## 2019-06-03 DIAGNOSIS — R42 Dizziness and giddiness: Secondary | ICD-10-CM

## 2019-06-04 ENCOUNTER — Other Ambulatory Visit: Payer: Self-pay | Admitting: Nurse Practitioner

## 2019-06-04 MED ORDER — DIAZEPAM 5 MG PO TABS: 5.0000 mg | ORAL_TABLET | Freq: Two times a day (BID) | ORAL | 1 refills | Status: DC | PRN

## 2019-06-05 ENCOUNTER — Encounter: Payer: Self-pay | Admitting: Neurology

## 2019-06-09 ENCOUNTER — Encounter: Payer: Self-pay | Admitting: Nurse Practitioner

## 2019-06-10 NOTE — Telephone Encounter (Signed)
I do not see a referral in her chart for Vestibular rehab unless I overlooked it?

## 2019-06-11 ENCOUNTER — Encounter: Payer: Self-pay | Admitting: Nurse Practitioner

## 2019-06-11 ENCOUNTER — Other Ambulatory Visit: Payer: Self-pay | Admitting: Nurse Practitioner

## 2019-06-11 DIAGNOSIS — R42 Dizziness and giddiness: Secondary | ICD-10-CM

## 2019-06-12 ENCOUNTER — Other Ambulatory Visit: Payer: Self-pay | Admitting: Nurse Practitioner

## 2019-06-15 ENCOUNTER — Other Ambulatory Visit: Payer: Self-pay | Admitting: Nurse Practitioner

## 2019-06-15 DIAGNOSIS — R42 Dizziness and giddiness: Secondary | ICD-10-CM

## 2019-06-17 ENCOUNTER — Ambulatory Visit (INDEPENDENT_AMBULATORY_CARE_PROVIDER_SITE_OTHER): Payer: BC Managed Care – PPO | Admitting: Rehabilitative and Restorative Service Providers"

## 2019-06-17 ENCOUNTER — Other Ambulatory Visit: Payer: Self-pay

## 2019-06-17 ENCOUNTER — Encounter: Payer: Self-pay | Admitting: Rehabilitative and Restorative Service Providers"

## 2019-06-17 DIAGNOSIS — R42 Dizziness and giddiness: Secondary | ICD-10-CM | POA: Diagnosis not present

## 2019-06-17 NOTE — Therapy (Signed)
[facility] [place] [address] [place], [place], [address] Phone: [phone]   Fax:  [phone]  Physical Therapy Evaluation  Patient Details  Name: Denise Huff MRN: [id number] Date of Birth: 12-22-1980 Referring Provider (PT): Enid Skeens, NP   Encounter Date: 06/17/2019  PT End of Session - 06/17/19 1238    Visit Number  1    Number of Visits  6    Date for PT Re-Evaluation  07/29/19    PT Start Time  1133    PT Stop Time  1230    PT Time Calculation (min)  57 min       History reviewed. No pertinent past medical history.  Past Surgical History:  Procedure Laterality Date  . APPENDECTOMY    . CESAREAN SECTION    . KNEE SURGERY    . TONSILLECTOMY    . TYMPANOSTOMY TUBE PLACEMENT      There were no vitals filed for this visit.   Subjective Assessment - 06/17/19 1132    Subjective  The patient notes onset of dizziness May 28, 2019 when returning from a trip.  She got onset of drunken sensation, nausea that was constant. The only thing that reduced symtpoms was movement in a car.  Symptoms worsened and she underwent MRI on 06/02/19 (slowed speech, severe dizziness, visual changes, visual hallucinations -- dark spots, tinnitus).    History of vertigo in the past after a cruise in 2016 with "horrific vertigo" in which she felt she was moving for 2-3 months.  She also had vertigo in 2017 and had a fall forward with hitting her head.  Currently, she has tried Epley's without success and she feels like symptoms are decreasing since onset.  She continues to get a sensation that she could fall forward when reaching into the trunk or dryer.  She also notes a visual oscillopsia with walking (vertical plane). She is currently taking valium to treat symtpoms (2x/day-- last dose Monday @ 3:30pm).    Pertinent History  Migraines 1x/month in history (monitors triggers carefully), last bad migraine was 08/2018 (3 or 4 in her life that  are severe), frequent HA (not migraines)    Patient Stated Goals  Get rid of this sensation.    Currently in Pain?  Yes    Pain Score  2     Pain Location  Head    Pain Orientation  --   frontal        [facility] PT Assessment - 06/17/19 1153      Assessment   Medical Diagnosis  Vertigo    Referring Provider (PT)  Enid Skeens, NP    Onset Date/Surgical Date  05/28/19    Prior Therapy  none      Precautions   Precautions  None      Restrictions   Weight Bearing Restrictions  No      Balance Screen   Has the patient fallen in the past 6 months  No    Has the patient had a decrease in activity level because of a fear of falling?   Yes   sensation of drunkeness with condition   Is the patient reluctant to leave their home because of a fear of falling?   No      Home Environment   Living Environment  Private residence      Observation/Other Assessments   Focus on Therapeutic Outcomes (FOTO)   ADD      Sensation   Light  Touch  Appears Intact           Vestibular Assessment - 06/17/19 1155      Vestibular Assessment   General Observation  Patient walks into clinic independently without device.   Baseline is 3-4/10 described as anterior/posterior rocking      Symptom Behavior   Subjective history of current problem  Sudden onsetof symptoms after air travel 05/28/19    Type of Dizziness   Oscillopsia;Imbalance   sensation she is moving   Frequency of Dizziness  daily, worse when she sits down at end of day    Duration of Dizziness  minutes to hours    Symptom Nature  Spontaneous    Aggravating Factors  --   sitting still    Relieving Factors  --   Movement improves symptoms   Progression of Symptoms  Better    History of similar episodes  Patient had initial episode in 2016 after a cruise, then again in 2017 that responded to Epley's.      Oculomotor Exam   Oculomotor Alignment  Normal    Ocular ROM  WFL    Spontaneous  Absent    Gaze-induced   Absent    Smooth  Pursuits  Intact    Saccades  Intact    Comment  has typical visual floaters, does not get a visual aura before migraines      Vestibulo-Ocular Reflex   VOR 1 Head Only (x 1 viewing)  Gaze adaptation x 1 viewing x 10 reps is a 7/10 provoking a sensation of rocking ant/posteriorly in horizontal plane;   Vertical gaze adaptation x 1 x 5 reps provokes no dizziness    VOR Cancellation  Normal    Comment  Head impulse test=positive to the R side -- unable to refixate back to target.  Gets 4/10 dizziness with this movement.      Positional Testing   Dix-Hallpike  Dix-Hallpike Right;Dix-Hallpike Left    Horizontal Canal Testing  Horizontal Canal Right;Horizontal Canal Left      Dix-Hallpike Right   Dix-Hallpike Right Duration  no dizziness moving into position, head rush sensation with returning to sit    Dix-Hallpike Right Symptoms  No nystagmus      Dix-Hallpike Left   Dix-Hallpike Left Duration  no dizziness    Dix-Hallpike Left Symptoms  No nystagmus      Horizontal Canal Right   Horizontal Canal Right Duration  none    Horizontal Canal Right Symptoms  Normal      Horizontal Canal Left   Horizontal Canal Left Duration  none    Horizontal Canal Left Symptoms  Normal          Objective measurements completed on examination: See above findings.      [place] Adult PT Treatment/Exercise - 06/17/19 1246      Self-Care   Self-Care  Other Self-Care Comments    Other Self-Care Comments   educated patient on mal de debarquement and possible sensitivity to multi-sensory balance conditions due to h/o migraines and visual floaters.  Discussed PT role in treating .      Neuro Re-ed    Neuro Re-ed Details   Compliant foam standing with eyes open x 30 seconds.       Vestibular Treatment/Exercise - 06/17/19 1210      Vestibular Treatment/Exercise   Vestibular Treatment Provided  Gaze;Habituation    Habituation Exercises  Standing Horizontal Head Turns    Gaze Exercises  X1 Viewing  Horizontal  Standing Horizontal Head Turns   Number of Reps   5    Symptom Description   on foam with feet apart      X1 Viewing Horizontal   Foot Position  standing x 5 repetitions (attempted 15 seconds at slow pace but unable to tolerate due to imbalance and dizziness)    Comments  patient steadies herself at the wall after exercise            PT Education - 06/17/19 1159    Education Details  Ashby Dawes of mal de debarqument and HEP    Person(s) Educated  Patient    Methods  Explanation;Demonstration;Handout    Comprehension  Verbalized understanding;Returned demonstration          PT Long Term Goals - 06/17/19 1238      PT LONG TERM GOAL #1   Title  The patient will be indep with HEP for gaze adaptation and mulit-sensory training.    Time  6    Period  Weeks    Target Date  07/29/19      PT LONG TERM GOAL #2   Title  The patient will reduce functional limitation per FOTO from 41% to < or equal to 20% to demonstrate improved ,obility.    Time  6    Period  Weeks    Target Date  07/29/19      PT LONG TERM GOAL #3   Title  The patient will tolerate gaze adaptation x 1 viewing x 60 seconds with change in dizziness < or equal to  2/10 from baseline.    Time  6    Period  Weeks    Target Date  07/29/19      PT LONG TERM GOAL #4   Title  The patient will tolerate foam standing with eyes open and closed x 30 seconds without c/o swaying.    Time  6    Period  Weeks    Target Date  07/29/19             Plan - 06/17/19 1241    Clinical Impression Statement  The patient is a 39 yo female presenting to OP physical therapy with sudden onset of dizziness 05/28/19 after air travel.  She has h/o prior episodes of vertigo in 2016 after a cruise (lastingmonths), 2017 that responded to positional treatment, and also a h/o migraines.  At today's evaluation, the patient was negative for positional testing.  She did demonstrate decreased use of vestibular ocular reflex with  + head impulse testing to the right, motion sensitivity with horizontal head motions, and postural sway on foam.  The patient has functional limitations of dec'd household/community mobility ,dec'd participation in community wellness program.  PT to address deficits through habituation, multi-sensory balance training, and gaze adaptation.  We also discussed using physioball to sit on for sensory input versus sitting still.    Personal Factors and Comorbidities  Comorbidity 1    Comorbidities  migraines    Examination-Activity Limitations  Locomotion Level;Bend    Examination-Participation Restrictions  Community Activity;Cleaning    Stability/Clinical Decision Making  Stable/Uncomplicated    Clinical Decision Making  Low    Rehab Potential  Good    PT Frequency  1x / week    PT Duration  6 weeks    PT Treatment/Interventions  ADLs/Self Care Home Management;Canalith Repostioning;Vestibular;Gait training;Stair training;Neuromuscular re-education;Balance training;Therapeutic exercise;Therapeutic activities;Patient/family education    PT Next Visit Plan  Progress gaze adaptation, add habituation head turns,  continue to develop multi-sensory balance training and dynamic gait wiht eye/hand coordination tasks.    PT Home Exercise Plan  Access Code: [email or web address]    Consulted and Agree with Plan of Care  Patient       Patient will benefit from skilled therapeutic intervention in order to improve the following deficits and impairments:  Dizziness, Decreased balance, Impaired sensation  Visit Diagnosis: Dizziness and giddiness     Problem List Patient Active Problem List   Diagnosis Date Noted  . Skin tag 05/19/2018  . Patella-femoral syndrome 10/28/2017  . Mixed hyperlipidemia 10/28/2017  . Depression with anxiety 10/28/2017  . Pernicious anemia 11/27/2016  . Atrophy of muscle of left thigh 11/27/2016  . Venous insufficiency of left leg 11/27/2016  . Sore throat, chronic 12/03/2013  . S/P  laparoscopic appendectomy 11/01/2010    Eltha Tingley,Idamay, PT 06/17/2019, 12:47 PM  [facility] [address] [place], [place], [address] Phone: [phone]   Fax:  [phone]  Name: Denise Huff MRN: [id number] Date of Birth: Aug 30, 1980

## 2019-06-17 NOTE — Patient Instructions (Signed)
Access Code: [email or web address] URL: [email or web address] Date: 06/17/2019 Prepared by: Margretta Ditty  Program Notes REMEMBER:   Keep dizziness at 5/10 or less If you get nausea, stop the exercise and rest If you find these exercises increase frequency of migraines, then divide them to do one exercise at a time (you may not be able to do 2x or 3 x per day).   Exercises Standing Gaze Stabilization with Head Rotation - 3 x daily - 7 x weekly - 1 sets - 5-8 reps Standing on Foam Pad - 2 x daily - 7 x weekly - 1 sets - 3 reps - 60 seconds hold Wide Stance with Head Rotation on Foam Pad - 2 x daily - 7 x weekly - 1 sets - 5 reps

## 2019-06-26 ENCOUNTER — Encounter: Payer: Self-pay | Admitting: Rehabilitative and Restorative Service Providers"

## 2019-06-26 ENCOUNTER — Ambulatory Visit: Payer: BC Managed Care – PPO | Admitting: Rehabilitative and Restorative Service Providers"

## 2019-06-26 ENCOUNTER — Other Ambulatory Visit: Payer: Self-pay

## 2019-06-26 DIAGNOSIS — R42 Dizziness and giddiness: Secondary | ICD-10-CM | POA: Diagnosis not present

## 2019-06-26 NOTE — Patient Instructions (Addendum)
Access Code: [email or web address] URL: [email or web address] Date: 06/17/2019 Prepared by: Margretta Ditty  Program Notes REMEMBER:   Keep dizziness at 5/10 or less If you get nausea, stop the exercise and rest If you find these exercises increase frequency of migraines, then divide them to do one exercise at a time (you may not be able to do 2x or 3 x per day).   Exercises Standing Gaze Stabilization with Head Rotation - 3 x daily - 7 x weekly - 1 sets - 1 reps - 30 seconds hold Standing on Foam Pad - 2 x daily - 7 x weekly - 1 sets - 3 reps - 60 seconds hold Wide Stance with Head Rotation on Foam Pad - 2 x daily - 7 x weekly - 1 sets - 5 reps Wide Tandem Stance on Foam Pad with Eyes Closed - 2 x daily - 7 x weekly - 1 sets - 3 reps - 30 seconds hold Tandem Stance in Corner - 2 x daily - 7 x weekly - 1 sets - 2 reps - 30 seconds hold Seated to Fold Over Vestibular Habituation - 2 x daily - 7 x weekly - 1 sets - 5 reps Quadruped Rocking Slow - 2 x daily - 7 x weekly - 1 sets - 5 reps

## 2019-06-26 NOTE — Therapy (Signed)
[place], [place], [address] Phone: [phone]   Fax:  [phone]  Physical Therapy Treatment  Patient Details  Name: Denise Huff MRN: [id number] Date of Birth: 06-03-1980 Referring Provider (PT): Jacolyn Reedy, NP   Encounter Date: 06/26/2019  PT End of Session - 06/26/19 1216    Visit Number  2    Number of Visits  6    Date for PT Re-Evaluation  07/29/19    PT Start Time  0539    PT Stop Time  1230    PT Time Calculation (min)  42 min       History reviewed. No pertinent past medical history.  Past Surgical History:  Procedure Laterality Date  . APPENDECTOMY    . CESAREAN SECTION    . KNEE SURGERY    . TONSILLECTOMY    . TYMPANOSTOMY TUBE PLACEMENT      There were no vitals filed for this visit.  Subjective Assessment - 06/26/19 1150    Subjective  The patient reports that she is now going all day without dizziness and she gets a trace sensation of dizziness.  She only gets the sensation of oscillopsia when walking in busy environments, no longer in home environments.  She no longer feels dizziness when sitting down on her stool at school.  The most provovking movement at this time is when bending forward (putting something in the dryer or the trunk of the car).    Pertinent History  Migraines 1x/month in history (monitors triggers carefully), last bad migraine was 08/2018 (3 or 4 in her life that are severe), frequent HA (not migraines)    Patient Stated Goals  Get rid of this sensation.    Currently in Pain?  Yes    Pain Score  --   [place] this morning but it is gone.                      [facility] Adult PT Treatment/Exercise - 06/26/19 1219      Self-Care   Self-Care  Other Self-Care Comments    Other Self-Care Comments   discussed habituation and working through portions of movement for HEP and ovement tolerance;  discussed progression of gaze adaptation  exercises.       Neuro Re-ed    Neuro Re-ed Details   Quadriped habituation working on rocking with visual fixation x 5 reps.  Compliant surface standing with eyes open, eyes closed x 30 seconds.  Performed narrow base with tandem standing x 30 second holds.        Vestibular Treatment/Exercise - 06/26/19 1209      Vestibular Treatment/Exercise   Vestibular Treatment Provided  Habituation;Gaze    Habituation Exercises  Standing Vertical Head Turns;Seated Vertical Head Turns    Gaze Exercises  X1 Viewing Horizontal      Seated Vertical Head Turns   Number of Reps   5    Symptom Description   The patient rests in between.  Initially begins at 2/10, and it builds up to 4/10 with repetition.  She also gets queasiness.    *forward fold  bending head to knees.      Standing Vertical Head Turns   Number of Reps   3    Symptom Description   no dizziness            PT Education - 06/26/19 1222    Education Details  progressed HEP  Person(s) Educated  Patient;Spouse;Caregiver(s)    Methods  Explanation;Demonstration    Comprehension  Verbalized understanding;Returned demonstration          PT Long Term Goals - 06/17/19 1238      PT LONG TERM GOAL #1   Title  The patient will be indep with HEP for gaze adaptation and mulit-sensory training.    Time  6    Period  Weeks    Target Date  07/29/19      PT LONG TERM GOAL #2   Title  The patient will reduce functional limitation per FOTO from 41% to < or equal to 20% to demonstrate improved ,obility.    Time  6    Period  Weeks    Target Date  07/29/19      PT LONG TERM GOAL #3   Title  The patient will tolerate gaze adaptation x 1 viewing x 60 seconds with change in dizziness < or equal to  2/10 from baseline.    Time  6    Period  Weeks    Target Date  07/29/19      PT LONG TERM GOAL #4   Title  The patient will tolerate foam standing with eyes open and closed x 30 seconds without c/o swaying.    Time  6    Period   Weeks    Target Date  07/29/19            Plan - 06/26/19 1234    Clinical Impression Statement  The patient has made significant gains with initial HEP.  She continues with sensation of rocking when reaching into trunk and has occasional trace amounts of dizziness at end of the day.  PT progressed HEP for balance, multi-sensory training, gaze, and motion sensitivity.  Instructed patient in progression of adaptation exercises.    Rehab Potential  Good    PT Frequency  1x / week    PT Duration  6 weeks    PT Treatment/Interventions  ADLs/Self Care Home Management;Canalith Repostioning;Vestibular;Gait training;Stair training;Neuromuscular re-education;Balance training;Therapeutic exercise;Therapeutic activities;Patient/family education    PT Next Visit Plan  Progress gaze adaptation, add habituation head turns, continue to develop multi-sensory balance training and dynamic gait wiht eye/hand coordination tasks.    PT Home Exercise Plan  Access Code: [email or web address]    Consulted and Agree with Plan of Care  Patient       Patient will benefit from skilled therapeutic intervention in order to improve the following deficits and impairments:  Dizziness, Decreased balance, Impaired sensation  Visit Diagnosis: Dizziness and giddiness     Problem List Patient Active Problem List   Diagnosis Date Noted  . Skin tag 05/19/2018  . Patella-femoral syndrome 10/28/2017  . Mixed hyperlipidemia 10/28/2017  . Depression with anxiety 10/28/2017  . Pernicious anemia 11/27/2016  . Atrophy of muscle of left thigh 11/27/2016  . Venous insufficiency of left leg 11/27/2016  . Sore throat, chronic 12/03/2013  . S/P laparoscopic appendectomy 11/01/2010    [name],[name], PT 06/26/2019, 12:37 PM  [facility] [address] [place], [place], [address] Phone: [phone]   Fax:  [phone]  Name: Denise Huff MRN: [id number] Date of Birth:  March 23, 1980

## 2019-07-08 ENCOUNTER — Other Ambulatory Visit: Payer: Self-pay

## 2019-07-08 ENCOUNTER — Ambulatory Visit (INDEPENDENT_AMBULATORY_CARE_PROVIDER_SITE_OTHER): Payer: BC Managed Care – PPO | Admitting: Rehabilitative and Restorative Service Providers"

## 2019-07-08 ENCOUNTER — Encounter: Payer: Self-pay | Admitting: Rehabilitative and Restorative Service Providers"

## 2019-07-08 DIAGNOSIS — R42 Dizziness and giddiness: Secondary | ICD-10-CM | POA: Diagnosis not present

## 2019-07-08 NOTE — Therapy (Signed)
[place], [place], [address] Phone: [phone]   Fax:  [phone]  Physical Therapy Treatment  Patient Details  Name: Denise Huff MRN: [id number] Date of Birth: [birth year] Referring Provider (PT): Jacolyn Reedy, NP   Encounter Date: 07/08/2019  PT End of Session - 07/08/19 1106    Visit Number  3    Number of Visits  6    Date for PT Re-Evaluation  07/29/19    PT Start Time  9983    PT Stop Time  1140    PT Time Calculation (min)  29 min       History reviewed. No pertinent past medical history.  Past Surgical History:  Procedure Laterality Date  . APPENDECTOMY    . CESAREAN SECTION    . KNEE SURGERY    . TONSILLECTOMY    . TYMPANOSTOMY TUBE PLACEMENT      There were no vitals filed for this visit.  Subjective Assessment - 07/08/19 1105    Subjective  The patient reports she is paying attention to getting into the car.  She has been able to remove groceries from the trunk with visual spotting and did not get dizziness.  She is still moving slowly and has returned to child's pose and up dog when she is at [employer].  She had one bad HA within the last 10 days that brought on intense spinning sensation.  The patient reports sensation of rocking is 90% in the evening.  She no longer notes visual oscipllopsia with gait or swaying when sitting on the stool.  She gets some dizziness with forward bending, however feels this is improved by 50%.    Pertinent History  Migraines 1x/month in history (monitors triggers carefully), last bad migraine was 08/2018 (3 or 4 in her life that are severe), frequent HA (not migraines)    Patient Stated Goals  Get rid of this sensation.    Currently in Pain?  No/denies                        [facility] Adult PT Treatment/Exercise - 07/08/19 1224      Self-Care   Self-Care  Other Self-Care Comments    Other Self-Care Comments   discussed  progression of motion sensitivity program working on 180 degree turns and adding tae kwon do moves for home.      Neuro Re-ed    Neuro Re-ed Details   compliant surface standing with eyes open and eyes closed      Vestibular Treatment/Exercise - 07/08/19 1225      Vestibular Treatment/Exercise   Vestibular Treatment Provided  Habituation;Gaze    Habituation Exercises  Comment   forward bending x 3 reps dec'ing ROM   Gaze Exercises  X1 Viewing Horizontal      X1 Viewing Horizontal   Foot Position  standing    Comments  improved tolerance to 60 seconds (from 5 reps at eval)            PT Education - 07/08/19 1223    Education Details  progressed gaze x 1, recommended tae kwon do turns    Person(s) Educated  Patient    Methods  Explanation;Demonstration    Comprehension  Verbalized understanding;Returned demonstration          PT Long Term Goals - 07/08/19 1128      PT LONG TERM GOAL #1   Title  The patient  will be indep with HEP for gaze adaptation and mulit-sensory training.    Time  6    Period  Weeks      PT LONG TERM GOAL #2   Title  The patient will reduce functional limitation per FOTO from 41% to < or equal to 20% to demonstrate improved ,obility.    Baseline  81% (19%limitation)    Time  6    Period  Weeks    Status  Achieved      PT LONG TERM GOAL #3   Title  The patient will tolerate gaze adaptation x 1 viewing x 60 seconds with change in dizziness < or equal to  2/10 from baseline.    Baseline  Not dizzy, mild sensation of "pounding" sensation when she stops    Time  6    Period  Weeks    Status  Achieved      PT LONG TERM GOAL #4   Title  The patient will tolerate foam standing with eyes open and closed x 30 seconds without c/o swaying.    Baseline  Met on 07/08/19    Time  6    Period  Weeks    Status  Achieved            Plan - 07/08/19 1223    Clinical Impression Statement  The patient continues with progress with motion tolerance  and feels almost full resolution of rocking sensation.  PT continues to progress HEP to tolerance.  Plan to allow patinet time to continue to progress HEP and return in 3-4 weeks for update.    Rehab Potential  Good    PT Frequency  1x / week    PT Duration  6 weeks    PT Treatment/Interventions  ADLs/Self Care Home Management;Canalith Repostioning;Vestibular;Gait training;Stair training;Neuromuscular re-education;Balance training;Therapeutic exercise;Therapeutic activities;Patient/family education    PT Next Visit Plan  assess HEP, dynamic gait, motion sensitivity progression    PT Home Exercise Plan  Access Code: [email or web address]    Consulted and Agree with Plan of Care  Patient       Patient will benefit from skilled therapeutic intervention in order to improve the following deficits and impairments:  Dizziness, Decreased balance, Impaired sensation  Visit Diagnosis: Dizziness and giddiness     Problem List Patient Active Problem List   Diagnosis Date Noted  . Skin tag 05/19/2018  . Patella-femoral syndrome 10/28/2017  . Mixed hyperlipidemia 10/28/2017  . Depression with anxiety 10/28/2017  . Pernicious anemia 11/27/2016  . Atrophy of muscle of left thigh 11/27/2016  . Venous insufficiency of left leg 11/27/2016  . Sore throat, chronic 12/03/2013  . S/P laparoscopic appendectomy 11/01/2010    Deanne Bedgood,Michel, PT 07/08/2019, 12:26 PM  [facility] [place], [place], [address] Phone: [phone]   Fax:  [phone]  Name: Denise Huff MRN: [id number] Date of Birth: 06/20/1980

## 2019-08-06 ENCOUNTER — Other Ambulatory Visit: Payer: Self-pay

## 2019-08-06 ENCOUNTER — Ambulatory Visit: Payer: BC Managed Care – PPO | Admitting: Rehabilitative and Restorative Service Providers"

## 2019-08-06 DIAGNOSIS — R42 Dizziness and giddiness: Secondary | ICD-10-CM

## 2019-08-06 NOTE — Therapy (Signed)
[place], [place], [address] Phone: [phone]   Fax:  [phone]  Physical Therapy Treatment and Discharge Summary  Patient Details  Name: Denise Huff MRN: [id number] Date of Birth: 1980-02-27 Referring Provider (PT): Jacolyn Reedy, NP   Encounter Date: 08/06/2019   PT End of Session - 08/06/19 1155    Visit Number 4    Number of Visits 6    Date for PT Re-Evaluation 07/29/19    PT Start Time 1150    PT Stop Time 1210    PT Time Calculation (min) 20 min    Activity Tolerance Patient tolerated treatment well    Behavior During Therapy [facility] for tasks assessed/performed           No past medical history on file.  Past Surgical History:  Procedure Laterality Date  . APPENDECTOMY    . CESAREAN SECTION    . KNEE SURGERY    . TONSILLECTOMY    . TYMPANOSTOMY TUBE PLACEMENT      There were no vitals filed for this visit.   Subjective Assessment - 08/06/19 1151    Subjective The patient had one episode of dizziness where she got a migraine and increased incidence of vertigo.  Excedrin + resting, and the vertigo subsided as the migraine subsided.  Vertigo returned to baseline prior to migraine.  She has had a couple of episodes of dizziness when she is in between cars or leaning into the trunk of the [place].  She feels this is more of a spatial awareness issue that is her standard.  She has returned to doing all prior activities however goes slow at times.  She feels she has returned to baseline.    Pertinent History Migraines 1x/month in history (monitors triggers carefully), last bad migraine was 08/2018 (3 or 4 in her life that are severe), frequent HA (not migraines)    Patient Stated Goals Get rid of this sensation.    Currently in Pain? No/denies                             [facility] Adult PT Treatment/Exercise - 08/06/19 1206      Self-Care   Self-Care Other Self-Care  Comments    Other Self-Care Comments  PT and the patient discussed continuing gaze and high level balance HEP for tandem and eyes closed.  We discussed using VOR x 1 viewing as a strategy to reduce future episodes of mal de debarquement associated with travel.  The patient feels completely returned to baseline and back to prior activities.  She plans to use vestibular rehab exercises as indicated.                         PT Long Term Goals - 08/06/19 1156      PT LONG TERM GOAL #1   Title The patient will be indep with HEP for gaze adaptation and mulit-sensory training.    Time 6    Period Weeks    Status Achieved    Target Date 07/29/19      PT LONG TERM GOAL #2   Title The patient will reduce functional limitation per FOTO from 41% to < or equal to 20% to demonstrate improved ,obility.    Baseline 81% (19%limitation)    Time 6    Period Weeks    Status Achieved  PT LONG TERM GOAL #3   Title The patient will tolerate gaze adaptation x 1 viewing x 60 seconds with change in dizziness < or equal to  2/10 from baseline.    Baseline Not dizzy, mild sensation of "pounding" sensation when she stops    Time 6    Period Weeks    Status Achieved      PT LONG TERM GOAL #4   Title The patient will tolerate foam standing with eyes open and closed x 30 seconds without c/o swaying.    Baseline Met on 07/08/19    Time 6    Period Weeks    Status Achieved                 Plan - 08/06/19 1338    Clinical Impression Statement The patient has met all LTGs.  PT doing renewal for today's visit as we decided to wait 4 weeks and then f/u to ensure she continued to make progress with HEP.  Patient has HEP to complete if future needs arise.    Rehab Potential Good    PT Frequency 1x / week    PT Duration 6 weeks    PT Treatment/Interventions ADLs/Self Care Home Management;Canalith Repostioning;Vestibular;Gait training;Stair training;Neuromuscular re-education;Balance  training;Therapeutic exercise;Therapeutic activities;Patient/family education    PT Next Visit Plan discharge today    PT Home Exercise Plan Access Code: [email or web address]    Consulted and Agree with Plan of Care Patient           Patient will benefit from skilled therapeutic intervention in order to improve the following deficits and impairments:  Dizziness, Decreased balance, Impaired sensation  Visit Diagnosis: Dizziness and giddiness     Problem List Patient Active Problem List   Diagnosis Date Noted  . Skin tag 05/19/2018  . Patella-femoral syndrome 10/28/2017  . Mixed hyperlipidemia 10/28/2017  . Depression with anxiety 10/28/2017  . Pernicious anemia 11/27/2016  . Atrophy of muscle of left thigh 11/27/2016  . Venous insufficiency of left leg 11/27/2016  . Sore throat, chronic 12/03/2013  . S/P laparoscopic appendectomy 11/01/2010   PHYSICAL THERAPY DISCHARGE SUMMARY  Visits from Start of Care: 4  Current functional level related to goals / functional outcomes: See above   Remaining deficits: Patient has returned to baseline. Does note episode of vertigo with migraine.   Education / Equipment: Home program, self mgmt of symptoms.  Plan: Patient agrees to discharge.  Patient goals were met. Patient is being discharged due to meeting the stated rehab goals.  ?????         Recommendations:  Patient to contact MD if frequency, intensity, or quality of migraines varies.  She noted episode of vertigo associated with recent migraine, however vertigo symptoms resolved with migraine.    Thank you for the referral of this patient. Rudell Cobb, MPT    [place], PT 08/06/2019, 1:40 PM  [facility] [place], [place], [address] Phone: [phone]   Fax:  [phone]  Name: Denise Huff MRN: [id number] Date of Birth: 1980-04-04

## 2019-08-12 ENCOUNTER — Ambulatory Visit: Payer: BC Managed Care – PPO | Admitting: Neurology

## 2019-08-19 DIAGNOSIS — Z136 Encounter for screening for cardiovascular disorders: Secondary | ICD-10-CM | POA: Diagnosis not present

## 2019-08-19 DIAGNOSIS — E7800 Pure hypercholesterolemia, unspecified: Secondary | ICD-10-CM | POA: Diagnosis not present

## 2019-08-19 DIAGNOSIS — Z713 Dietary counseling and surveillance: Secondary | ICD-10-CM | POA: Diagnosis not present

## 2019-08-19 DIAGNOSIS — Z131 Encounter for screening for diabetes mellitus: Secondary | ICD-10-CM | POA: Diagnosis not present

## 2019-08-19 DIAGNOSIS — Z6827 Body mass index (BMI) 27.0-27.9, adult: Secondary | ICD-10-CM | POA: Diagnosis not present

## 2019-08-19 DIAGNOSIS — Z13220 Encounter for screening for lipoid disorders: Secondary | ICD-10-CM | POA: Diagnosis not present

## 2019-08-19 DIAGNOSIS — Z0130 Encounter for examination of blood pressure without abnormal findings: Secondary | ICD-10-CM | POA: Diagnosis not present

## 2019-09-23 ENCOUNTER — Other Ambulatory Visit: Payer: Self-pay

## 2019-09-23 ENCOUNTER — Encounter: Payer: Self-pay | Admitting: Family Medicine

## 2019-09-23 ENCOUNTER — Ambulatory Visit (INDEPENDENT_AMBULATORY_CARE_PROVIDER_SITE_OTHER): Payer: BC Managed Care – PPO | Admitting: Family Medicine

## 2019-09-23 VITALS — BP 108/65 | HR 74 | Ht 67.0 in | Wt 179.0 lb

## 2019-09-23 DIAGNOSIS — Z1159 Encounter for screening for other viral diseases: Secondary | ICD-10-CM | POA: Diagnosis not present

## 2019-09-23 DIAGNOSIS — Z0000 Encounter for general adult medical examination without abnormal findings: Secondary | ICD-10-CM

## 2019-09-23 DIAGNOSIS — D510 Vitamin B12 deficiency anemia due to intrinsic factor deficiency: Secondary | ICD-10-CM

## 2019-09-23 MED ORDER — CYANOCOBALAMIN 1000 MCG/ML IJ SOLN: 1000.0000 ug | INTRAMUSCULAR | 1 refills | Status: DC

## 2019-09-23 NOTE — Patient Instructions (Signed)
Health Maintenance, Female Adopting a healthy lifestyle and getting preventive care are important in promoting health and wellness. Ask your health care provider about:  The right schedule for you to have regular tests and exams.  Things you can do on your own to prevent diseases and keep yourself healthy. What should I know about diet, weight, and exercise? Eat a healthy diet   Eat a diet that includes plenty of vegetables, fruits, low-fat dairy products, and lean protein.  Do not eat a lot of foods that are high in solid fats, added sugars, or sodium. Maintain a healthy weight Body mass index (BMI) is used to identify weight problems. It estimates body fat based on height and weight. Your health care provider can help determine your BMI and help you achieve or maintain a healthy weight. Get regular exercise Get regular exercise. This is one of the most important things you can do for your health. Most adults should:  Exercise for at least 150 minutes each week. The exercise should increase your heart rate and make you sweat (moderate-intensity exercise).  Do strengthening exercises at least twice a week. This is in addition to the moderate-intensity exercise.  Spend less time sitting. Even light physical activity can be beneficial. Watch cholesterol and blood lipids Have your blood tested for lipids and cholesterol at 38 years of age, then have this test every 5 years. Have your cholesterol levels checked more often if:  Your lipid or cholesterol levels are high.  You are older than 38 years of age.  You are at high risk for heart disease. What should I know about cancer screening? Depending on your health history and family history, you may need to have cancer screening at various ages. This may include screening for:  Breast cancer.  Cervical cancer.  Colorectal cancer.  Skin cancer.  Lung cancer. What should I know about heart disease, diabetes, and high blood  pressure? Blood pressure and heart disease  High blood pressure causes heart disease and increases the risk of stroke. This is more likely to develop in people who have high blood pressure readings, are of African descent, or are overweight.  Have your blood pressure checked: ? Every 3-5 years if you are 18-39 years of age. ? Every year if you are 40 years old or older. Diabetes Have regular diabetes screenings. This checks your fasting blood sugar level. Have the screening done:  Once every three years after age 40 if you are at a normal weight and have a low risk for diabetes.  More often and at a younger age if you are overweight or have a high risk for diabetes. What should I know about preventing infection? Hepatitis B If you have a higher risk for hepatitis B, you should be screened for this virus. Talk with your health care provider to find out if you are at risk for hepatitis B infection. Hepatitis C Testing is recommended for:  Everyone born from 1945 through 1965.  Anyone with known risk factors for hepatitis C. Sexually transmitted infections (STIs)  Get screened for STIs, including gonorrhea and chlamydia, if: ? You are sexually active and are younger than 38 years of age. ? You are older than 38 years of age and your health care provider tells you that you are at risk for this type of infection. ? Your sexual activity has changed since you were last screened, and you are at increased risk for chlamydia or gonorrhea. Ask your health care provider if   you are at risk.  Ask your health care provider about whether you are at high risk for HIV. Your health care provider may recommend a prescription medicine to help prevent HIV infection. If you choose to take medicine to prevent HIV, you should first get tested for HIV. You should then be tested every 3 months for as long as you are taking the medicine. Pregnancy  If you are about to stop having your period (premenopausal) and  you may become pregnant, seek counseling before you get pregnant.  Take 400 to 800 micrograms (mcg) of folic acid every day if you become pregnant.  Ask for birth control (contraception) if you want to prevent pregnancy. Osteoporosis and menopause Osteoporosis is a disease in which the bones lose minerals and strength with aging. This can result in bone fractures. If you are 65 years old or older, or if you are at risk for osteoporosis and fractures, ask your health care provider if you should:  Be screened for bone loss.  Take a calcium or vitamin D supplement to lower your risk of fractures.  Be given hormone replacement therapy (HRT) to treat symptoms of menopause. Follow these instructions at home: Lifestyle  Do not use any products that contain nicotine or tobacco, such as cigarettes, e-cigarettes, and chewing tobacco. If you need help quitting, ask your health care provider.  Do not use street drugs.  Do not share needles.  Ask your health care provider for help if you need support or information about quitting drugs. Alcohol use  Do not drink alcohol if: ? Your health care provider tells you not to drink. ? You are pregnant, may be pregnant, or are planning to become pregnant.  If you drink alcohol: ? Limit how much you use to 0-1 drink a day. ? Limit intake if you are breastfeeding.  Be aware of how much alcohol is in your drink. In the [place] one drink equals one 12 oz bottle of beer (355 mL), one 5 oz glass of wine (148 mL), or one 1 oz glass of hard liquor (44 mL). General instructions  Schedule regular health, dental, and eye exams.  Stay current with your vaccines.  Tell your health care provider if: ? You often feel depressed. ? You have ever been abused or do not feel safe at home. Summary  Adopting a healthy lifestyle and getting preventive care are important in promoting health and wellness.  Follow your health care provider's instructions about healthy  diet, exercising, and getting tested or screened for diseases.  Follow your health care provider's instructions on monitoring your cholesterol and blood pressure. This information is not intended to replace advice given to you by your health care provider. Make sure you discuss any questions you have with your health care provider. Document Revised: 01/29/2018 Document Reviewed: 01/29/2018 Elsevier Patient Education  2020 [employer]  

## 2019-09-23 NOTE — Progress Notes (Signed)
Subjective:     Denise Huff is a 39 y.o. female and is here for a comprehensive physical exam. The patient reports no problems.  She says she is really enjoyed her summer and has been able to decompress and relax.  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  She did want to let me know that she had 5 maternal great aunts with breast cancer.  So far her mother has not been diagnosed with this but she has been try to get her mom to get genetically tested since she is clearly a potentially higher risk.  She does have an OB GYN appointment scheduled.  Social History   Socioeconomic History  . Marital status: Married    Spouse name: Theodoro Grist  . Number of children: 2  . Years of education: College   . Highest education level: Not on file  Occupational History  . Occupation: Runner, broadcasting/film/video    Comment: Middle sCHOOL  Tobacco Use  . Smoking status: Never Smoker  . Smokeless tobacco: Never Used  Vaping Use  . Vaping Use: Never used  Substance and Sexual Activity  . Alcohol use: Yes    Comment: rarely  . Drug use: Never  . Sexual activity: Yes    Partners: Male    Birth control/protection: Condom  Other Topics Concern  . Not on file  Social History Narrative  . Not on file   Social Determinants of Health   Financial Resource Strain:   . Difficulty of Paying Living Expenses:   Food Insecurity:   . Worried About Programme researcher, broadcasting/film/video in the Last Year:   . Barista in the Last Year:   Transportation Needs:   . Freight forwarder (Medical):   Marland Kitchen Lack of Transportation (Non-Medical):   Physical Activity:   . Days of Exercise per Week:   . Minutes of Exercise per Session:   Stress:   . Feeling of Stress :   Social Connections:   . Frequency of Communication with Friends and Family:   . Frequency of Social Gatherings with Friends and Family:   . Attends Religious Services:   . Active Member of Clubs or Organizations:   . Attends Banker  Meetings:   Marland Kitchen Marital Status:   Intimate Partner Violence:   . Fear of Current or Ex-Partner:   . Emotionally Abused:   Marland Kitchen Physically Abused:   . Sexually Abused:    Health Maintenance  Topic Date Due  . Hepatitis C Screening  Never done  . HIV Screening  Never done  . INFLUENZA VACCINE  09/20/2019  . PAP SMEAR-Modifier  08/04/2020  . TETANUS/TDAP  10/19/2023  . COVID-19 Vaccine  Completed    The following portions of the patient's history were reviewed and updated as appropriate: allergies, current medications, past family history, past medical history, past social history, past surgical history and problem list.  Review of Systems A comprehensive review of systems was negative.   Objective:    BP 108/65   Pulse 74   Ht 5\' 7"  (1.702 m)   Wt 179 lb (81.2 kg)   LMP 09/16/2019 (Exact Date)   SpO2 99%   BMI 28.04 kg/m  General appearance: alert, cooperative and appears stated age Head: Normocephalic, without obvious abnormality, atraumatic Eyes: conj clear, EOMI, PEERLA Ears: normal TM's and external ear canals both ears Nose: Nares normal. Septum midline. Mucosa normal. No drainage or sinus tenderness. Throat:  lips, mucosa, and tongue normal; teeth and gums normal Neck: no adenopathy, no carotid bruit, no JVD, supple, symmetrical, trachea midline and thyroid not enlarged, symmetric, no tenderness/mass/nodules Back: symmetric, no curvature. ROM normal. No CVA tenderness. Lungs: clear to auscultation bilaterally Heart: regular rate and rhythm, S1, S2 normal, no murmur, click, rub or gallop Abdomen: soft, non-tender; bowel sounds normal; no masses,  no organomegaly Extremities: extremities normal, atraumatic, no cyanosis or edema Pulses: 2+ and symmetric Skin: Skin color, texture, turgor normal. No rashes or lesions Lymph nodes: Cervical, supraclavicular, and axillary nodes normal. Neurologic: Alert and oriented X 3, normal strength and tone. Normal symmetric reflexes.  Normal coordination and gait    Assessment:    Healthy female exam.     Plan:     See After Visit Summary for Counseling Recommendations   Keep up a regular exercise program and make sure you are eating a healthy diet Try to eat 4 servings of dairy a day, or if you are lactose intolerant take a calcium with vitamin D daily.  Your vaccines are up to date.

## 2019-09-24 LAB — COMPLETE METABOLIC PANEL WITH GFR
AG Ratio: 1.7 (calc) (ref 1.0–2.5)
ALT: 15 U/L (ref 6–29)
AST: 13 U/L (ref 10–30)
Albumin: 4.4 g/dL (ref 3.6–5.1)
Alkaline phosphatase (APISO): 46 U/L (ref 31–125)
BUN: 11 mg/dL (ref 7–25)
CO2: 27 mmol/L (ref 20–32)
Calcium: 9.5 mg/dL (ref 8.6–10.2)
Chloride: 103 mmol/L (ref 98–110)
Creat: 0.82 mg/dL (ref 0.50–1.10)
GFR, Est African American: 106 mL/min/{1.73_m2} (ref >=60)
GFR, Est Non African American: 91 mL/min/{1.73_m2} (ref >=60)
Globulin: 2.6 g/dL (calc) (ref 1.9–3.7)
Glucose, Bld: 96 mg/dL (ref 65–99)
Potassium: 4.3 mmol/L (ref 3.5–5.3)
Sodium: 140 mmol/L (ref 135–146)
Total Bilirubin: 0.7 mg/dL (ref 0.2–1.2)
Total Protein: 7 g/dL (ref 6.1–8.1)

## 2019-09-24 LAB — CBC
HCT: 39.1 % (ref 35.0–45.0)
Hemoglobin: 13.2 g/dL (ref 11.7–15.5)
MCH: 31.7 pg (ref 27.0–33.0)
MCHC: 33.8 g/dL (ref 32.0–36.0)
MCV: 93.8 fL (ref 80.0–100.0)
MPV: 9.6 fL (ref 7.5–12.5)
Platelets: 299 10*3/uL (ref 140–400)
RBC: 4.17 10*6/uL (ref 3.80–5.10)
RDW: 11.7 % (ref 11.0–15.0)
WBC: 5.3 10*3/uL (ref 3.8–10.8)

## 2019-09-24 LAB — LIPID PANEL W/REFLEX DIRECT LDL
Cholesterol: 163 mg/dL (ref <200)
HDL: 46 mg/dL — ABNORMAL LOW (ref >=50)
LDL Cholesterol (Calc): 85 mg/dL (calc)
Non-HDL Cholesterol (Calc): 117 mg/dL (calc) (ref <130)
Total CHOL/HDL Ratio: 3.5 (calc) (ref <5.0)
Triglycerides: 234 mg/dL — ABNORMAL HIGH (ref <150)

## 2019-09-24 LAB — VITAMIN B12: Vitamin B-12: 1105 pg/mL — ABNORMAL HIGH (ref 200–1100)

## 2019-09-24 LAB — HEPATITIS C ANTIBODY
Hepatitis C Ab: NONREACTIVE
SIGNAL TO CUT-OFF: 0.01 (ref <1.00)

## 2019-09-24 LAB — TSH: TSH: 1 mIU/L

## 2019-10-05 ENCOUNTER — Other Ambulatory Visit: Payer: Self-pay

## 2019-10-05 ENCOUNTER — Ambulatory Visit (INDEPENDENT_AMBULATORY_CARE_PROVIDER_SITE_OTHER): Payer: BC Managed Care – PPO

## 2019-10-05 ENCOUNTER — Ambulatory Visit (INDEPENDENT_AMBULATORY_CARE_PROVIDER_SITE_OTHER): Payer: BC Managed Care – PPO | Admitting: Family Medicine

## 2019-10-05 ENCOUNTER — Encounter: Payer: Self-pay | Admitting: Family Medicine

## 2019-10-05 VITALS — BP 119/60 | HR 83 | Ht 67.0 in | Wt 184.0 lb

## 2019-10-05 DIAGNOSIS — S8992XA Unspecified injury of left lower leg, initial encounter: Secondary | ICD-10-CM

## 2019-10-05 IMAGING — DX DG KNEE COMPLETE 4+V*L*
4 series · 4 of 4 positions shown · non-contrast
Comparison: None.

CLINICAL DATA: Medial left knee pain after injury 4 days ago

EXAM:
LEFT KNEE - COMPLETE 4+ VIEW

[knee ap]
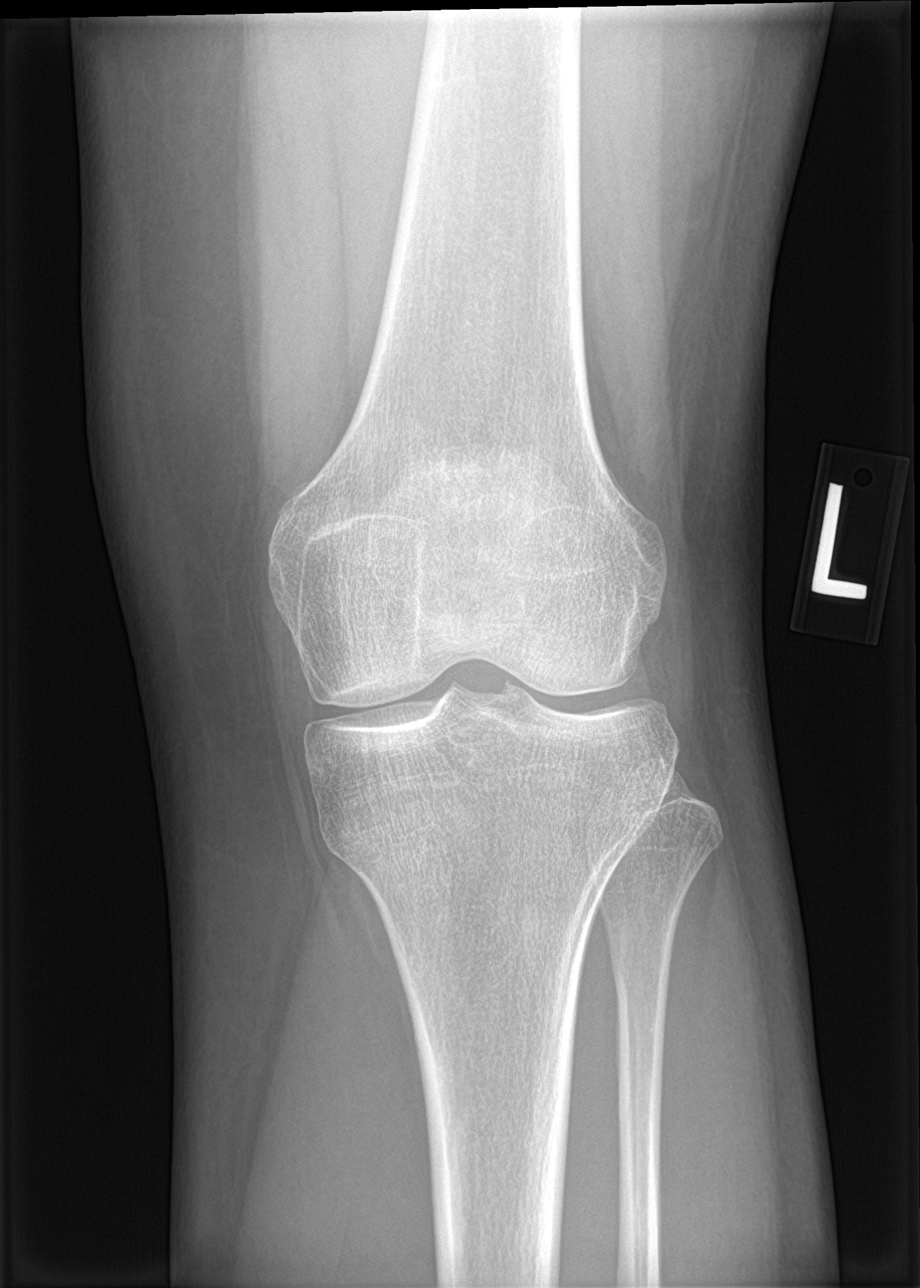

[knee lat]
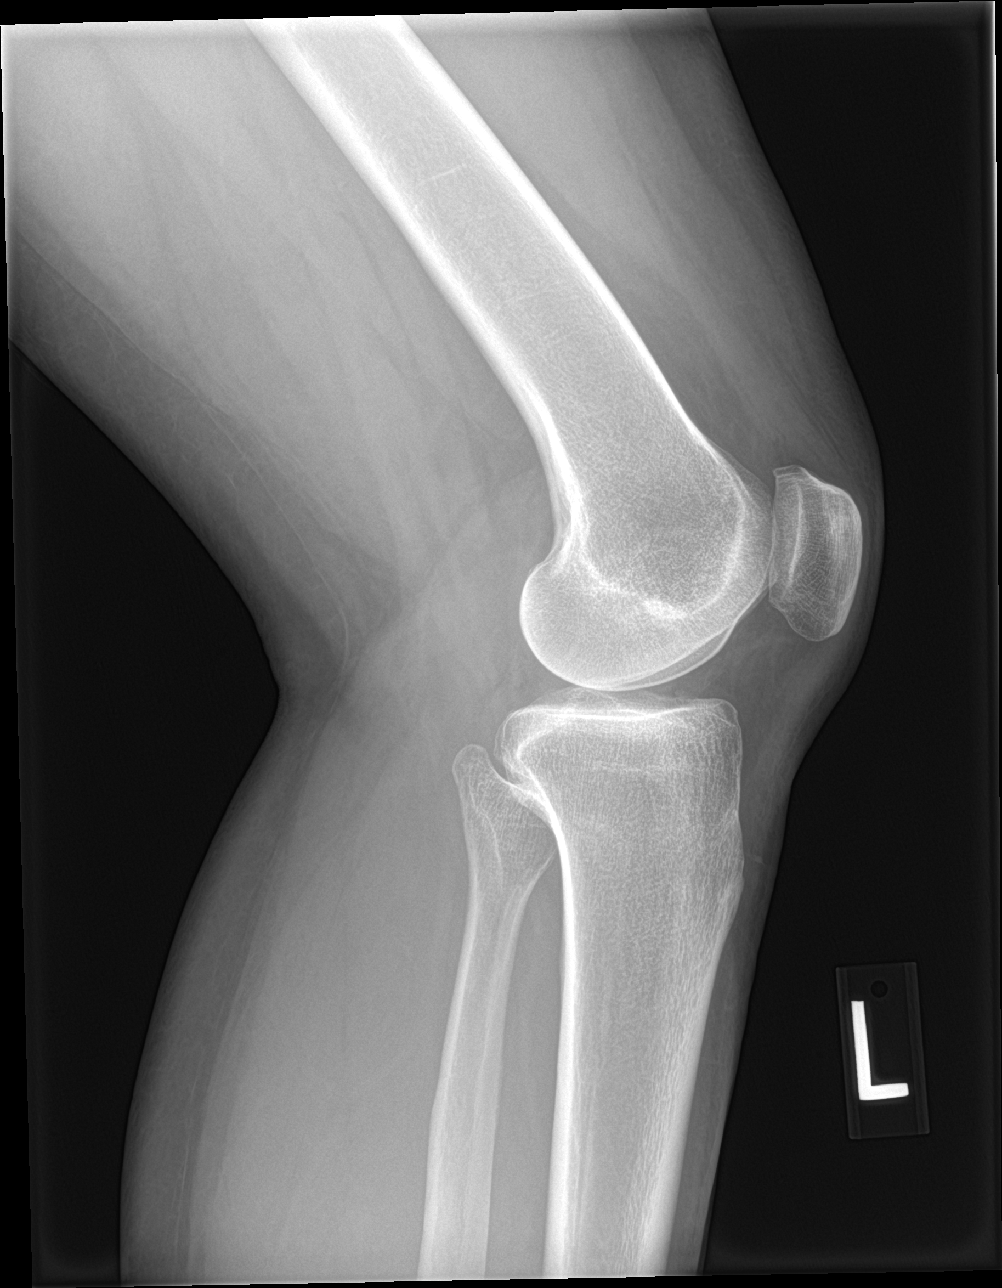

[knee obl (1 of 2)]
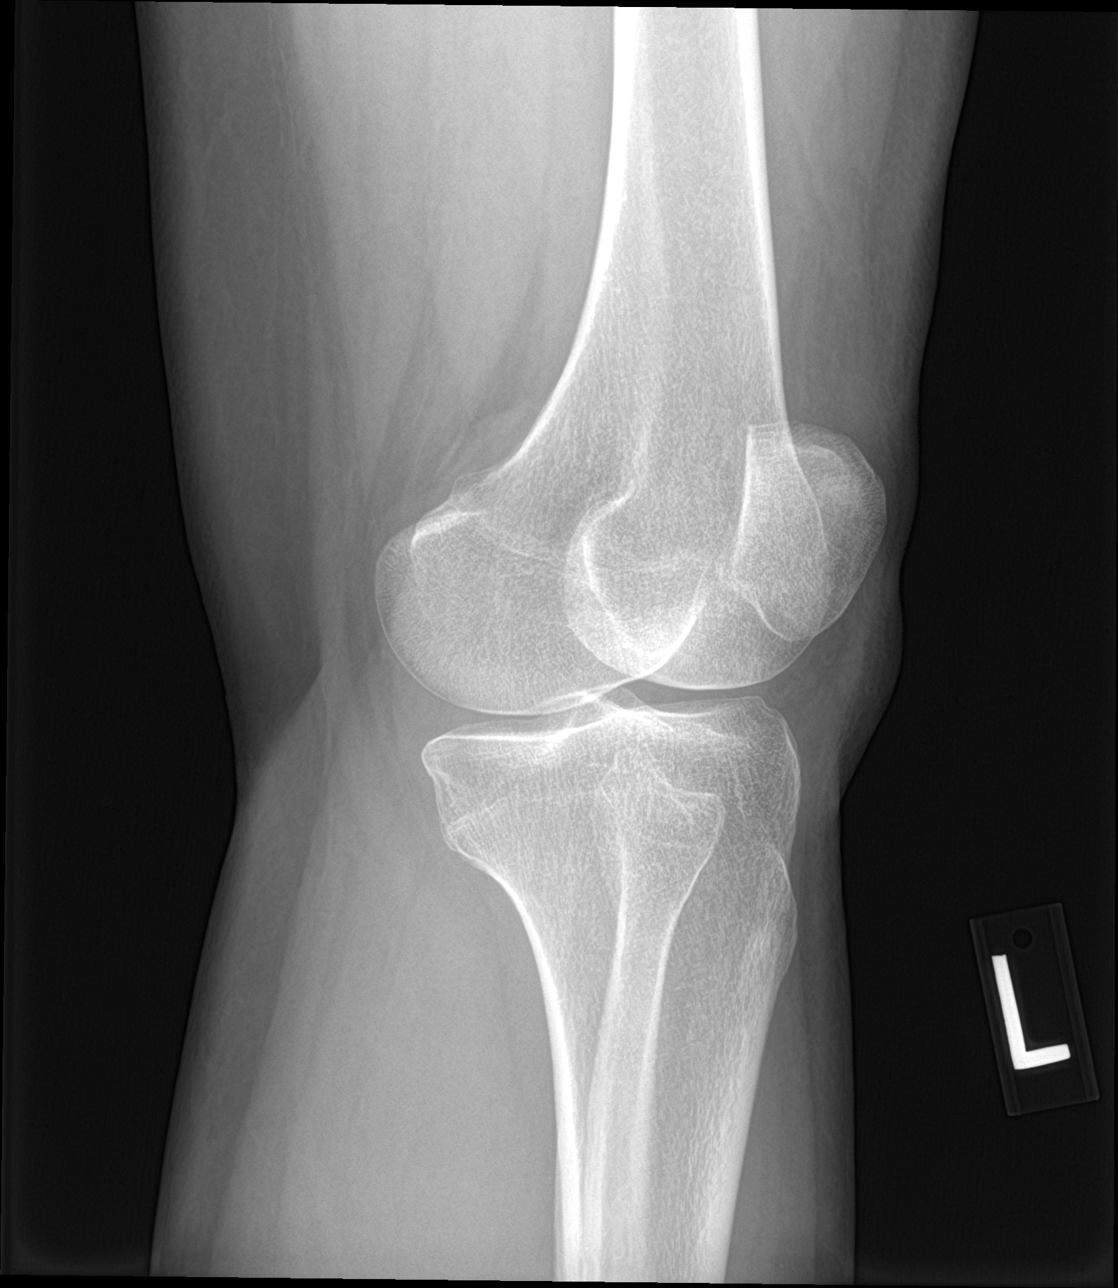

[knee obl (2 of 2)]
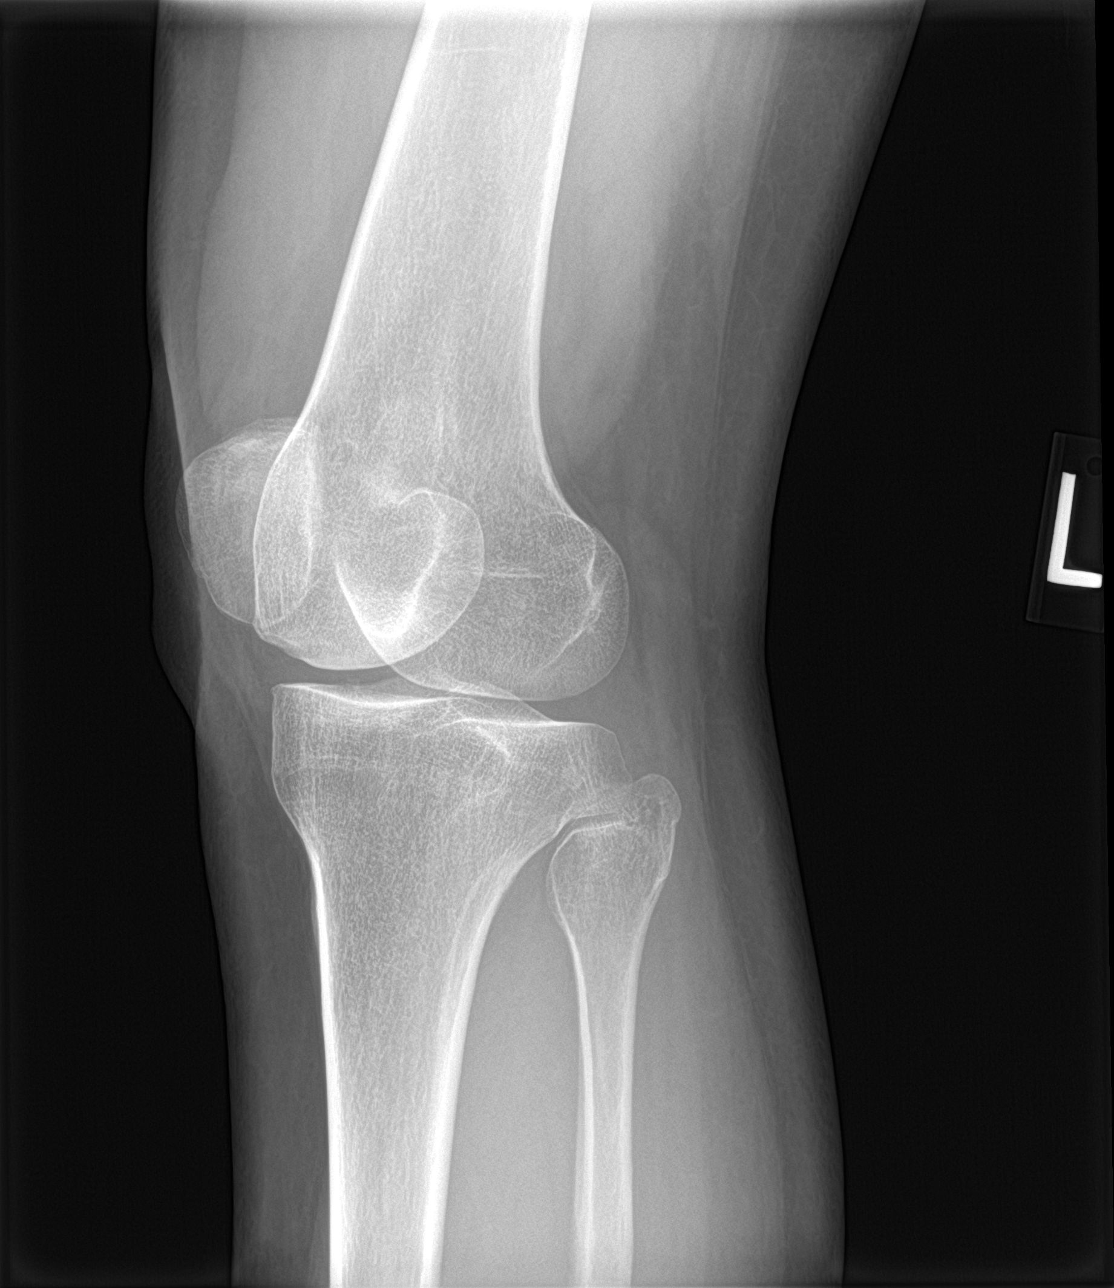

[4 of 4 positions shown; findings below may reference images not displayed]

FINDINGS: No evidence of fracture, dislocation, or joint effusion. No evidence
of arthropathy or other focal bone abnormality. Soft tissues are
unremarkable.
IMPRESSION: Negative.

## 2019-10-05 NOTE — Patient Instructions (Signed)
Continue with your ibuprofen 600 mg every 6 hours as needed okay to decrease down to 400 mg if feeling better after the next couple days.  Continue icing for 15 to 20 minutes 3 times a day as well as compression and elevation.

## 2019-10-05 NOTE — Progress Notes (Signed)
Acute Office Visit  Subjective:    Patient ID: Denise Huff, female    DOB: 01-19-1981, 39 y.o.   MRN: [id number]  Chief Complaint  Patient presents with  . Knee Injury    HPI Patient is in today for Left knee injury that occurred on Thursday.  Has tried PRICE.  Occurred after a jumping/kicking motion.  She was standing on the left leg when she did a jump and a kick and then landed back on the left leg and had sudden intense pain.  She says she is able to walk but it does get worse the longer she walks.  She is really tried putting a pillow under it and elevating it because she just cannot get comfortable especially at night she is been taking 600 mg of ibuprofen pretty regularly.  She has had some painful popping.  She does get frequent popping even before the injury but usually its not painful.  She has had some significant swelling and tenderness is mostly medially.  He has been wearing a knee sleeve.  No past medical history on file.  Past Surgical History:  Procedure Laterality Date  . APPENDECTOMY    . CESAREAN SECTION    . KNEE SURGERY    . TONSILLECTOMY    . TYMPANOSTOMY TUBE PLACEMENT      Family History  Problem Relation Age of Onset  . Skin cancer Mother   . Depression Mother   . CAD Father 68       triple bypass   . Hypertension Father   . Skin cancer Father   . Diabetes Father   . Polycystic ovary syndrome Sister   . Depression Sister   . Breast cancer Maternal Aunt     Social History   Socioeconomic History  . Marital status: Married    Spouse name: Theodoro Grist  . Number of children: 2  . Years of education: College   . Highest education level: Not on file  Occupational History  . Occupation: Runner, broadcasting/film/video    Comment: Middle sCHOOL  Tobacco Use  . Smoking status: Never Smoker  . Smokeless tobacco: Never Used  Vaping Use  . Vaping Use: Never used  Substance and Sexual Activity  . Alcohol use: Yes    Comment: rarely  . Drug use: Never  . Sexual  activity: Yes    Partners: Male    Birth control/protection: Condom  Other Topics Concern  . Not on file  Social History Narrative  . Not on file   Social Determinants of Health   Financial Resource Strain:   . Difficulty of Paying Living Expenses:   Food Insecurity:   . Worried About Programme researcher, broadcasting/film/video in the Last Year:   . Barista in the Last Year:   Transportation Needs:   . Freight forwarder (Medical):   Marland Kitchen Lack of Transportation (Non-Medical):   Physical Activity:   . Days of Exercise per Week:   . Minutes of Exercise per Session:   Stress:   . Feeling of Stress :   Social Connections:   . Frequency of Communication with Friends and Family:   . Frequency of Social Gatherings with Friends and Family:   . Attends Religious Services:   . Active Member of Clubs or Organizations:   . Attends Banker Meetings:   Marland Kitchen Marital Status:   Intimate Partner Violence:   . Fear of Current or Ex-Partner:   . Emotionally Abused:   .  Physically Abused:   . Sexually Abused:     Outpatient Medications Prior to Visit  Medication Sig Dispense Refill  . ALPRAZolam (XANAX) 0.25 MG tablet Take 1 tablet (0.25 mg total) by mouth at bedtime as needed for anxiety or sleep. 30 tablet 1  . atorvastatin (LIPITOR) 20 MG tablet TAKE 1 TABLET BY MOUTH EVERYDAY AT BEDTIME 90 tablet 1  . cyanocobalamin (,VITAMIN B-12,) 1000 MCG/ML injection Inject 1 mL (1,000 mcg total) into the muscle once a week. 12 mL 1  . LO LOESTRIN FE 1 MG-10 MCG / 10 MCG tablet Take 1 tablet by mouth daily.    . sertraline (ZOLOFT) 100 MG tablet TAKE 1.5 TABLETS BY MOUTH DAILY 135 tablet 1   No facility-administered medications prior to visit.    No Known Allergies  Review of Systems     Objective:    Physical Exam Vitals reviewed.  Constitutional:      Appearance: She is well-developed.  HENT:     Head: Normocephalic and atraumatic.  Eyes:     Conjunctiva/sclera: Conjunctivae normal.   Cardiovascular:     Rate and Rhythm: Normal rate.  Pulmonary:     Effort: Pulmonary effort is normal.  Musculoskeletal:     Comments: Left knee with trace swelling medially.  She is also very tender over the medial joint line.  Nontender over the patella or patellar tendon.  Nontender posteriorly.  No significant crepitus on exam.  No laxity with anterior drawer.  She did have pain with varus stress, but not valgus stress.  Negative McMurray's.  Skin:    General: Skin is dry.     Coloration: Skin is not pale.  Neurological:     Mental Status: She is alert and oriented to person, place, and time.  Psychiatric:        Behavior: Behavior normal.     BP 119/60   Pulse 83   Ht 5\' 7"  (1.702 m)   Wt 184 lb (83.5 kg)   LMP 09/16/2019 (Exact Date)   SpO2 100%   BMI 28.82 kg/m  Wt Readings from Last 3 Encounters:  10/05/19 184 lb (83.5 kg)  09/23/19 179 lb (81.2 kg)  06/01/19 173 lb (78.5 kg)    Health Maintenance Due  Topic Date Due  . HIV Screening  Never done  . INFLUENZA VACCINE  09/20/2019    There are no preventive care reminders to display for this patient.   Lab Results  Component Value Date   TSH 1.00 09/23/2019   Lab Results  Component Value Date   WBC 5.3 09/23/2019   HGB 13.2 09/23/2019   HCT 39.1 09/23/2019   MCV 93.8 09/23/2019   PLT 299 09/23/2019   Lab Results  Component Value Date   NA 140 09/23/2019   K 4.3 09/23/2019   CO2 27 09/23/2019   GLUCOSE 96 09/23/2019   BUN 11 09/23/2019   CREATININE 0.82 09/23/2019   BILITOT 0.7 09/23/2019   AST 13 09/23/2019   ALT 15 09/23/2019   PROT 7.0 09/23/2019   CALCIUM 9.5 09/23/2019   Lab Results  Component Value Date   CHOL 163 09/23/2019   Lab Results  Component Value Date   HDL 46 (L) 09/23/2019   Lab Results  Component Value Date   LDLCALC 85 09/23/2019   Lab Results  Component Value Date   TRIG 234 (H) 09/23/2019   Lab Results  Component Value Date   CHOLHDL 3.5 09/23/2019   Lab  Results  Component Value Date   HGBA1C 5.2 04/03/2018         Assessment & Plan:   Problem List Items Addressed This Visit    None    Visit Diagnoses    Injury of left knee, initial encounter    -  Primary   Relevant Orders   DG Knee Complete 4 Views Left     Left knee pain-continue with pressure, ice, compression, elevation.  Continue with 60 mg of ibuprofen.  We will get x-rays today just to rule out fracture.  Suspect she may have a bone contusion based on her tenderness.  I do not see any increased laxity of the joint.  If not improving over the next couple weeks with conservative therapy and consider referral to sports medicine for further evaluation.   No orders of the defined types were placed in this encounter.    Nani Gasser, MD

## 2019-10-11 ENCOUNTER — Other Ambulatory Visit: Payer: Self-pay | Admitting: Family Medicine

## 2019-10-29 ENCOUNTER — Ambulatory Visit: Payer: BC Managed Care – PPO | Admitting: Family Medicine

## 2019-11-25 LAB — HM PAP SMEAR: HM Pap smear: NEGATIVE

## 2020-03-03 ENCOUNTER — Encounter: Payer: Self-pay | Admitting: Nurse Practitioner

## 2020-03-03 ENCOUNTER — Telehealth (INDEPENDENT_AMBULATORY_CARE_PROVIDER_SITE_OTHER): Payer: BC Managed Care – PPO | Admitting: Nurse Practitioner

## 2020-03-03 DIAGNOSIS — R509 Fever, unspecified: Secondary | ICD-10-CM | POA: Diagnosis not present

## 2020-03-03 DIAGNOSIS — Z20822 Contact with and (suspected) exposure to covid-19: Secondary | ICD-10-CM | POA: Diagnosis not present

## 2020-03-03 DIAGNOSIS — R059 Cough, unspecified: Secondary | ICD-10-CM | POA: Diagnosis not present

## 2020-03-03 DIAGNOSIS — J029 Acute pharyngitis, unspecified: Secondary | ICD-10-CM | POA: Diagnosis not present

## 2020-03-03 LAB — POCT INFLUENZA A/B
Influenza A, POC: NEGATIVE
Influenza B, POC: NEGATIVE

## 2020-03-03 LAB — POCT RAPID STREP A (OFFICE)

## 2020-03-03 MED ORDER — FLUCONAZOLE 150 MG PO TABS: 150.0000 mg | ORAL_TABLET | Freq: Once | ORAL | 0 refills | Status: AC

## 2020-03-03 MED ORDER — PREDNISONE 20 MG PO TABS: 40.0000 mg | ORAL_TABLET | Freq: Every day | ORAL | 0 refills | Status: DC

## 2020-03-03 MED ORDER — AZITHROMYCIN 250 MG PO TABS: ORAL_TABLET | ORAL | 0 refills | Status: AC

## 2020-03-03 MED ORDER — LIDOCAINE VISCOUS HCL 2 % MT SOLN: 15.0000 mL | OROMUCOSAL | 1 refills | Status: DC | PRN

## 2020-03-03 NOTE — Progress Notes (Signed)
Denise Huff  Your flu test was negative. Unfortunately, the strep test resulted as "invalid" on the machine- I will cancel the order so you won't be charged for this, because it is a malfunction in the cartridge. Even more unfortunately, we cannot run it again without another swab. I am 99% sure it isn't strep, but if you want me to swab you again we can have you come by tomorrow and I will swab you if you like.

## 2020-03-03 NOTE — Progress Notes (Signed)
Virtual Video Visit via MyChart Note_Converted to telephone- pt unable to connect through MyChart application   I connected with  Denise Huff on 03/03/20 at  1:10 PM EST by the video enabled telemedicine application for , MyChart, and verified that I am speaking with the correct person using two identifiers.   I introduced myself as a Publishing rights manager with the practice. We discussed the limitations of evaluation and management by telemedicine and the availability of in person appointments. The patient expressed understanding and agreed to proceed.  Participating parties in this visit include: The patient and the nurse practitioner listed.  The patient is: At home I am: In the office  Subjective:    CC:  Chief Complaint  Patient presents with  . Covid Exposure    HPI: Denise Huff is a 40 y.o. year old female presenting today via MyChart today for sore throat, cough that comes and goes, low grade fevers (99.5), fatigue, mild nausea, body aches in the middle of her back, and chills. She reports that the symptoms started Friday and have progressively gotten worse. She tells me the cough is very bad and leaves her breathless with pain in the chest. She tells me the sore throat is severe.  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 She has been vaccinated and has received her booster. She has also had a flu shot this season.  She is eating and drinking fine and has not had any loss of taste or smell. She is having shortness of breath with the coughing spells only.   Past medical history, Surgical history, Family history not pertinant except as noted below, Social history, Allergies, and medications have been entered into the medical record, reviewed, and corrections made.   Review of Systems:  All review of systems negative except what is listed in the HPI   Objective:     General:  Speaking clearly in complete sentences. Absent shortness of breath noted.   Alert and oriented x3.   Normal judgment.  Absent acute distress.   Impression and Recommendations:    1. Cough in adult - lidocaine (XYLOCAINE) 2 % solution; Use as directed 15 mLs in the mouth or throat every 3 (three) hours as needed (mouth/throat pain - gargle and spit).  Dispense: 100 mL; Refill: 1 - predniSONE (DELTASONE) 20 MG tablet; Take 2 tablets (40 mg total) by mouth daily with breakfast.  Dispense: 10 tablet; Refill: 0 - azithromycin (ZITHROMAX) 250 MG tablet; 2 tabs PO on Day 1, then one a day x 4 days.  Dispense: 6 tablet; Refill: 0 - fluconazole (DIFLUCAN) 150 MG tablet; Take 1 tablet (150 mg total) by mouth once for 1 dose.  Dispense: 1 tablet; Refill: 0  2. Sore throat - lidocaine (XYLOCAINE) 2 % solution; Use as directed 15 mLs in the mouth or throat every 3 (three) hours as needed (mouth/throat pain - gargle and spit).  Dispense: 100 mL; Refill: 1  3. Fever with exposure to COVID-19 virus  Symptoms and presentation consistent with COVID-19 infection. Differentials include flu and strep throat.  Will provide testing in office today at 2:30. Work note provided discussing testing and quarantine measures.  Information provided on OTC recommendations for symptom management.  Have sent script for cough given the episodes of barking cough with chest pain that are consistent with pertussis like symptoms and her  exposure to children in her school. Recommend not starting antibiotic until test results are back.  Will start prednisone burst for the coughing episodes and history of prolonged bronchitis with respiratory illness.  Quarantine and OTC symptom management discussed.  Recommend follow-up if symptoms worsen or fail to improve.   I discussed the assessment and treatment plan with the patient. The patient was provided an opportunity to ask questions and all were answered. The patient  agreed with the plan and demonstrated an understanding of the instructions.   The patient was advised to call back or seek an in-person evaluation if the symptoms worsen or if the condition fails to improve as anticipated.  I provided 20 minutes of non-face-to-face interaction with this MYCHART visit including intake, same-day documentation, and chart review. Converted to telephone- pt unable to connect through MyChart application   Tollie Eth, NP

## 2020-03-03 NOTE — Patient Instructions (Signed)
Over the counter medications that may be helpful for symptoms: . Guaifenesin 1200 mg extended release tabs twice daily, with plenty of water o For cough and congestion o Brand name: Mucinex   . Pseudoephedrine 30 mg, one or two tabs every 4 to 6 hours o For sinus congestion o Brand name: Sudafed o You must get this from the pharmacy counter.  . Oxymetazoline nasal spray each morning, one spray in each nostril, for NO MORE THAN 3 days  o For nasal and sinus congestion o Brand name: Afrin . Saline nasal spray or Saline Nasal Irrigation 3-5 times a day o For nasal and sinus congestion o Brand names: Ocean or AYR . Fluticasone nasal spray, one spray in each nostril, each morning after oxymetazoline and saline, if used o For nasal and sinus congestion o Brand name: Flonase . Warm salt water gargles  o For sore throat o Every few hours as needed . Alternate ibuprofen 400-600 mg and acetaminophen 1000 mg every 4-6 hours o For fever, body aches, headache o Brand names: Motrin or Advil and Tylenol . Dextromethorphan 12-hour cough version 30 mg every 12 hours  o For cough o Brand name: Delsym Stop all other cold medications for now (Nyquil, Dayquil, Tylenol Cold, Theraflu, etc) and other non-prescription cough/cold preparations. Many of these have the same ingredients listed above and could cause an overdose of medication.   General Instructions . Allow your body to rest . Drink PLENTY of fluids . Isolate yourself from everyone, even family, until test results have returned  If your COVID-19 test is positive . Then you ARE INFECTED and you can pass the virus to others . You must quarantine from others for a minimum of  o 10 days since symptoms started AND o You are fever free for 24 hours WITHOUT any medication to reduce fever AND o Your symptoms are improving . Do not go to the store or other public areas . Do not go around household members who are not known to be infected with  COVID-19 . If you MUST leave you area of quarantine (example: go to a bathroom you share with others in your home), you must o Wear a mask o Wash your hands thoroughly o Wipe down any surfaces you touch . Do not share food, drinks, towels, or other items with other persons . Dispose of your own tissues, food containers, etc  Once you have recovered, please continue good preventive care measures, including:  . wearing a mask when in public . wash your hands frequently . avoid touching your face/nose/eyes . cover coughs/sneezes with the inside of your elbow . stay out of crowds . keep a 6 foot distance from others  Go to the nearest hospital emergency room if fever/cough/breathlessness are severe or illness seems like a threat to life.   

## 2020-03-05 LAB — SARS-COV-2, NAA 2 DAY TAT

## 2020-03-05 LAB — NOVEL CORONAVIRUS, NAA: SARS-CoV-2, NAA: NOT DETECTED

## 2020-03-07 ENCOUNTER — Other Ambulatory Visit: Payer: Self-pay | Admitting: Nurse Practitioner

## 2020-03-07 ENCOUNTER — Encounter: Payer: Self-pay | Admitting: Nurse Practitioner

## 2020-03-07 NOTE — Progress Notes (Signed)
COVID test negative. I will send in antibiotic for suspected bacterial infection. Please let me know if symptoms don't improve or worsen.

## 2020-03-08 ENCOUNTER — Encounter: Payer: Self-pay | Admitting: Nurse Practitioner

## 2020-03-13 ENCOUNTER — Encounter: Payer: Self-pay | Admitting: Nurse Practitioner

## 2020-03-15 NOTE — Telephone Encounter (Signed)
Please call and see if we get patient scheduled at the end of the day for evaluation.  With me

## 2020-03-15 NOTE — Telephone Encounter (Signed)
Called twice & left VM both times for patient to call back to get this appointment scheduled. AM

## 2020-03-20 ENCOUNTER — Other Ambulatory Visit: Payer: Self-pay | Admitting: Family Medicine

## 2020-03-20 DIAGNOSIS — D510 Vitamin B12 deficiency anemia due to intrinsic factor deficiency: Secondary | ICD-10-CM

## 2020-03-24 ENCOUNTER — Encounter: Payer: Self-pay | Admitting: Family Medicine

## 2020-03-25 ENCOUNTER — Ambulatory Visit: Payer: BC Managed Care – PPO | Admitting: Family Medicine

## 2020-04-04 ENCOUNTER — Ambulatory Visit (INDEPENDENT_AMBULATORY_CARE_PROVIDER_SITE_OTHER): Payer: BC Managed Care – PPO

## 2020-04-04 ENCOUNTER — Ambulatory Visit (INDEPENDENT_AMBULATORY_CARE_PROVIDER_SITE_OTHER): Payer: BC Managed Care – PPO | Admitting: Family Medicine

## 2020-04-04 ENCOUNTER — Encounter: Payer: Self-pay | Admitting: Family Medicine

## 2020-04-04 ENCOUNTER — Other Ambulatory Visit: Payer: Self-pay

## 2020-04-04 VITALS — BP 121/63 | HR 83 | Ht 67.0 in | Wt 177.0 lb

## 2020-04-04 DIAGNOSIS — R5383 Other fatigue: Secondary | ICD-10-CM | POA: Insufficient documentation

## 2020-04-04 DIAGNOSIS — R0789 Other chest pain: Secondary | ICD-10-CM

## 2020-04-04 DIAGNOSIS — F418 Other specified anxiety disorders: Secondary | ICD-10-CM | POA: Diagnosis not present

## 2020-04-04 DIAGNOSIS — R002 Palpitations: Secondary | ICD-10-CM

## 2020-04-04 NOTE — Assessment & Plan Note (Signed)
Dr. Sedalia Muta, her OB/GYN is now riding her medications but she is doing really well on her current regimen and is happy with it.

## 2020-04-04 NOTE — Assessment & Plan Note (Signed)
Unclear etiology.  The last time we checked her B12 her levels were adequate.  In fact they were actually high.  We will check for other deficiencies as well it sounds like she does not have sleep apnea.  No other sign of autoimmune disorder etc.  Consider chronic fatigue as a potential diagnosis assuming her labs all come back normal.  We will check for inflammatory markers as well as deficiencies.

## 2020-04-04 NOTE — Assessment & Plan Note (Signed)
Ever since she was ill with a viral respiratory illness a little over a month ago she has had intermittent tachycardia.  EKG today shows rate of 72 bpm, normal sinus rhythm with no acute ST-T wave changes.  We will also get a Zio patch placed to try to capture the episodes.  I actually did hear her become tachycardic during her exam today.  It also sounds like she may have had some pericarditis as well with chest pressure that is worse when she lays flat.

## 2020-04-04 NOTE — Progress Notes (Signed)
Established Patient Office Visit  Subjective:  Patient ID: Denise Huff, female    DOB: 03-06-1980  Age: 40 y.o. MRN: [id number]  CC:  Chief Complaint  Patient presents with  . Palpitations    HPI [name] presents for 6 mo for mood medication.  Currently on Zoloft 100 mg daily for depression with anxiety with as needed use xanax. Dr. Sedalia Muta is now writing this Rx for her.    She is following up on B12 def as well. She had tried going out as far as 10 days between injection but she would start to feel very sick like she was weak and nauseated and so went back to once a week.  Her last B12 was high when we checked it about 6 months ago she is due to have that rechecked.  Wonders if she could have chronic fatigue.  She says really for the last year it seems to be worse she has been taking lots of naps and sleeping excessively.  She reports she actually sleeps great at night.  She does not feel any disruption her husband says she does not snore.  She has not had any unusual fevers or sweats or joint pain or swelling.  She does have B12 deficiencies but taking her shots very regularly.  She also reports that she and she became sick in January she still feels short of breath occasionally she says even sometimes with minor activities she will notice that she also feels her heart just erratically speeding up again it can happen with just walking across the room but then she can do more intense activities and it does not seem to affect her.  She says occasionally she will get a sharp pain in her midsternal area that radiates outward.'s over the mid lower sternum.  She says it just feels sore most like someone is kicked her in the chest at times.  Ibuprofen does seem to help.  She had discussed some of these symptoms with Dr. Arbie Cookey and they had discussed possibly getting an EKG but she just could not make an arrangement for the appointment.  She also feels that the chest pressure is worse  when she lays flat and then she will start to cough.  History reviewed. No pertinent past medical history.  Past Surgical History:  Procedure Laterality Date  . APPENDECTOMY    . CESAREAN SECTION    . KNEE SURGERY    . TONSILLECTOMY    . TYMPANOSTOMY TUBE PLACEMENT      Family History  Problem Relation Age of Onset  . Skin cancer Mother   . Depression Mother   . CAD Father 40       triple bypass   . Hypertension Father   . Skin cancer Father   . Diabetes Father   . Polycystic ovary syndrome Sister   . Depression Sister   . Breast cancer Maternal Aunt     Social History   Socioeconomic History  . Marital status: Married    Spouse name: Theodoro Grist  . Number of children: 2  . Years of education: College   . Highest education level: Not on file  Occupational History  . Occupation: Runner, broadcasting/film/video    Comment: Middle sCHOOL  Tobacco Use  . Smoking status: Never Smoker  . Smokeless tobacco: Never Used  Vaping Use  . Vaping Use: Never used  Substance and Sexual Activity  . Alcohol use: Yes    Comment: rarely  . Drug use:  Never  . Sexual activity: Yes    Partners: Male    Birth control/protection: Condom  Other Topics Concern  . Not on file  Social History Narrative  . Not on file   Social Determinants of Health   Financial Resource Strain: Not on file  Food Insecurity: Not on file  Transportation Needs: Not on file  Physical Activity: Not on file  Stress: Not on file  Social Connections: Not on file  Intimate Partner Violence: Not on file    Outpatient Medications Prior to Visit  Medication Sig Dispense Refill  . JUNEL FE 1/20 1-20 MG-MCG tablet Take 1 tablet by mouth daily.  4  . ALPRAZolam (XANAX) 0.25 MG tablet Take 1 tablet (0.25 mg total) by mouth at bedtime as needed for anxiety or sleep. 30 tablet 1  . atorvastatin (LIPITOR) 20 MG tablet TAKE 1 TABLET BY MOUTH EVERYDAY AT BEDTIME 90 tablet 3  . cyanocobalamin (,VITAMIN B-12,) 1000 MCG/ML injection INJECT 1 ML  INTO THE MUSCLE ONCE A WEEK. 12 mL 1  . lidocaine (XYLOCAINE) 2 % solution Use as directed 15 mLs in the mouth or throat every 3 (three) hours as needed (mouth/throat pain - gargle and spit). 100 mL 1  . sertraline (ZOLOFT) 100 MG tablet TAKE 1.5 TABLETS BY MOUTH DAILY 135 tablet 1  . LO LOESTRIN FE 1 MG-10 MCG / 10 MCG tablet Take 1 tablet by mouth daily.    . predniSONE (DELTASONE) 20 MG tablet Take 2 tablets (40 mg total) by mouth daily with breakfast. 10 tablet 0   No facility-administered medications prior to visit.    No Known Allergies  ROS Review of Systems    Objective:    Physical Exam Constitutional:      Appearance: She is well-developed and well-nourished.  HENT:     Head: Normocephalic and atraumatic.  Cardiovascular:     Rate and Rhythm: Regular rhythm. Tachycardia present.     Heart sounds: Normal heart sounds.  Pulmonary:     Effort: Pulmonary effort is normal.     Breath sounds: Normal breath sounds.  Skin:    General: Skin is warm and dry.  Neurological:     Mental Status: She is alert and oriented to person, place, and time.  Psychiatric:        Mood and Affect: Mood and affect normal.        Behavior: Behavior normal.     BP 121/63   Pulse 83   Ht 5\' 7"  (1.702 m)   Wt 177 lb (80.3 kg)   SpO2 98%   BMI 27.72 kg/m  Wt Readings from Last 3 Encounters:  04/04/20 177 lb (80.3 kg)  10/05/19 184 lb (83.5 kg)  09/23/19 179 lb (81.2 kg)     Health Maintenance Due  Topic Date Due  . HIV Screening  Never done  . COVID-19 Vaccine (3 - Pfizer risk 4-dose series) 06/17/2019  . INFLUENZA VACCINE  09/20/2019    There are no preventive care reminders to display for this patient.  Lab Results  Component Value Date   TSH 1.00 09/23/2019   Lab Results  Component Value Date   WBC 5.3 09/23/2019   HGB 13.2 09/23/2019   HCT 39.1 09/23/2019   MCV 93.8 09/23/2019   PLT 299 09/23/2019   Lab Results  Component Value Date   NA 140 09/23/2019   K  4.3 09/23/2019   CO2 27 09/23/2019   GLUCOSE 96 09/23/2019   BUN 11  09/23/2019   CREATININE 0.82 09/23/2019   BILITOT 0.7 09/23/2019   AST 13 09/23/2019   ALT 15 09/23/2019   PROT 7.0 09/23/2019   CALCIUM 9.5 09/23/2019   Lab Results  Component Value Date   CHOL 163 09/23/2019   Lab Results  Component Value Date   HDL 46 (L) 09/23/2019   Lab Results  Component Value Date   LDLCALC 85 09/23/2019   Lab Results  Component Value Date   TRIG 234 (H) 09/23/2019   Lab Results  Component Value Date   CHOLHDL 3.5 09/23/2019   Lab Results  Component Value Date   HGBA1C 5.2 04/03/2018      Assessment & Plan:   Problem List Items Addressed This Visit      Other   Palpitations    Ever since she was ill with a viral respiratory illness a little over a month ago she has had intermittent tachycardia.  EKG today shows rate of 72 bpm, normal sinus rhythm with no acute ST-T wave changes.  We will also get a Zio patch placed to try to capture the episodes.  I actually did hear her become tachycardic during her exam today.  It also sounds like she may have had some pericarditis as well with chest pressure that is worse when she lays flat.      Relevant Orders   B12   TSH   LONG TERM MONITOR (3-14 DAYS)   Vitamin B6   Vitamin B1   Folate   Magnesium   Ferritin   Sedimentation rate   C-reactive protein   BASIC METABOLIC PANEL WITH GFR   CBC   EKG 12-Lead   Fatigue    Unclear etiology.  The last time we checked her B12 her levels were adequate.  In fact they were actually high.  We will check for other deficiencies as well it sounds like she does not have sleep apnea.  No other sign of autoimmune disorder etc.  Consider chronic fatigue as a potential diagnosis assuming her labs all come back normal.  We will check for inflammatory markers as well as deficiencies.      Relevant Orders   B12   TSH   Vitamin B6   Vitamin B1   Folate   Magnesium   Ferritin   Sedimentation  rate   C-reactive protein   BASIC METABOLIC PANEL WITH GFR   CBC   Depression with anxiety - Primary    Dr. Sedalia Muta, her OB/GYN is now riding her medications but she is doing really well on her current regimen and is happy with it.      Relevant Orders   B12   TSH   Vitamin B6   Vitamin B1   Folate   Magnesium   Ferritin   Sedimentation rate   C-reactive protein   BASIC METABOLIC PANEL WITH GFR   CBC    Other Visit Diagnoses    Atypical chest pain       Relevant Orders   B12   TSH   LONG TERM MONITOR (3-14 DAYS)   Vitamin B6   Vitamin B1   Folate   Magnesium   Ferritin   Sedimentation rate   C-reactive protein   BASIC METABOLIC PANEL WITH GFR   CBC      No orders of the defined types were placed in this encounter.   Follow-up: Return in about 2 months (around 06/02/2020) for chronic fatigue.    Nani Gasser, MD

## 2020-04-12 ENCOUNTER — Encounter: Payer: Self-pay | Admitting: Family Medicine

## 2020-04-12 NOTE — Telephone Encounter (Signed)
Avoid call caffeine, stimulants, etc.  Drink plenty of water and stay hydrated.  Lets wait until get monitor results before we start medication.

## 2020-04-12 NOTE — Telephone Encounter (Signed)
Patient would like to know if there is anything she can do to lower the heartrate or should she wait and see until she has to come back with the heart monitor. Please advise.

## 2020-04-15 LAB — TSH: TSH: 1.08 mIU/L

## 2020-04-15 LAB — FERRITIN: Ferritin: 46 ng/mL (ref 16–154)

## 2020-04-15 LAB — FOLATE: Folate: 13.7 ng/mL

## 2020-04-15 LAB — BASIC METABOLIC PANEL WITH GFR
BUN: 14 mg/dL (ref 7–25)
CO2: 30 mmol/L (ref 20–32)
Calcium: 9.4 mg/dL (ref 8.6–10.2)
Chloride: 105 mmol/L (ref 98–110)
Creat: 0.79 mg/dL (ref 0.50–1.10)
GFR, Est African American: 110 mL/min/{1.73_m2} (ref >=60)
GFR, Est Non African American: 95 mL/min/{1.73_m2} (ref >=60)
Glucose, Bld: 89 mg/dL (ref 65–99)
Potassium: 4.3 mmol/L (ref 3.5–5.3)
Sodium: 141 mmol/L (ref 135–146)

## 2020-04-15 LAB — C-REACTIVE PROTEIN: CRP: 0.5 mg/L (ref <8.0)

## 2020-04-15 LAB — CBC
HCT: 39.4 % (ref 35.0–45.0)
Hemoglobin: 13.5 g/dL (ref 11.7–15.5)
MCH: 31.6 pg (ref 27.0–33.0)
MCHC: 34.3 g/dL (ref 32.0–36.0)
MCV: 92.3 fL (ref 80.0–100.0)
MPV: 9.5 fL (ref 7.5–12.5)
Platelets: 279 10*3/uL (ref 140–400)
RBC: 4.27 10*6/uL (ref 3.80–5.10)
RDW: 12 % (ref 11.0–15.0)
WBC: 5.9 10*3/uL (ref 3.8–10.8)

## 2020-04-15 LAB — VITAMIN B6: Vitamin B6: 6.6 ng/mL (ref 2.1–21.7)

## 2020-04-15 LAB — SEDIMENTATION RATE: Sed Rate: 2 mm/h (ref 0–20)

## 2020-04-15 LAB — MAGNESIUM: Magnesium: 2.2 mg/dL (ref 1.5–2.5)

## 2020-04-15 LAB — VITAMIN B1: Vitamin B1 (Thiamine): 12 nmol/L (ref 8–30)

## 2020-04-15 LAB — VITAMIN B12: Vitamin B-12: 776 pg/mL (ref 200–1100)

## 2020-04-25 ENCOUNTER — Encounter: Payer: Self-pay | Admitting: Family Medicine

## 2020-04-26 MED ORDER — METOPROLOL SUCCINATE ER 25 MG PO TB24: 25.0000 mg | ORAL_TABLET | Freq: Every day | ORAL | 2 refills | Status: DC

## 2020-05-17 ENCOUNTER — Telehealth: Payer: Self-pay | Admitting: Sports Medicine

## 2020-05-17 ENCOUNTER — Ambulatory Visit (INDEPENDENT_AMBULATORY_CARE_PROVIDER_SITE_OTHER): Payer: BC Managed Care – PPO

## 2020-05-17 ENCOUNTER — Other Ambulatory Visit: Payer: Self-pay

## 2020-05-17 ENCOUNTER — Ambulatory Visit (INDEPENDENT_AMBULATORY_CARE_PROVIDER_SITE_OTHER): Payer: BC Managed Care – PPO | Admitting: Sports Medicine

## 2020-05-17 DIAGNOSIS — G8929 Other chronic pain: Secondary | ICD-10-CM | POA: Insufficient documentation

## 2020-05-17 DIAGNOSIS — S83206A Unspecified tear of unspecified meniscus, current injury, right knee, initial encounter: Secondary | ICD-10-CM | POA: Insufficient documentation

## 2020-05-17 DIAGNOSIS — Z09 Encounter for follow-up examination after completed treatment for conditions other than malignant neoplasm: Secondary | ICD-10-CM | POA: Diagnosis not present

## 2020-05-17 DIAGNOSIS — M170 Bilateral primary osteoarthritis of knee: Secondary | ICD-10-CM

## 2020-05-17 DIAGNOSIS — S8991XA Unspecified injury of right lower leg, initial encounter: Secondary | ICD-10-CM

## 2020-05-17 IMAGING — DX DG KNEE COMPLETE 4+V*R*
4 series · 4 of 4 positions shown · non-contrast
Comparison: [DATE]

CLINICAL DATA: Right knee pain for several days following exercise,
initial encounter

EXAM:
RIGHT KNEE - COMPLETE 4+ VIEW

[tunnel]
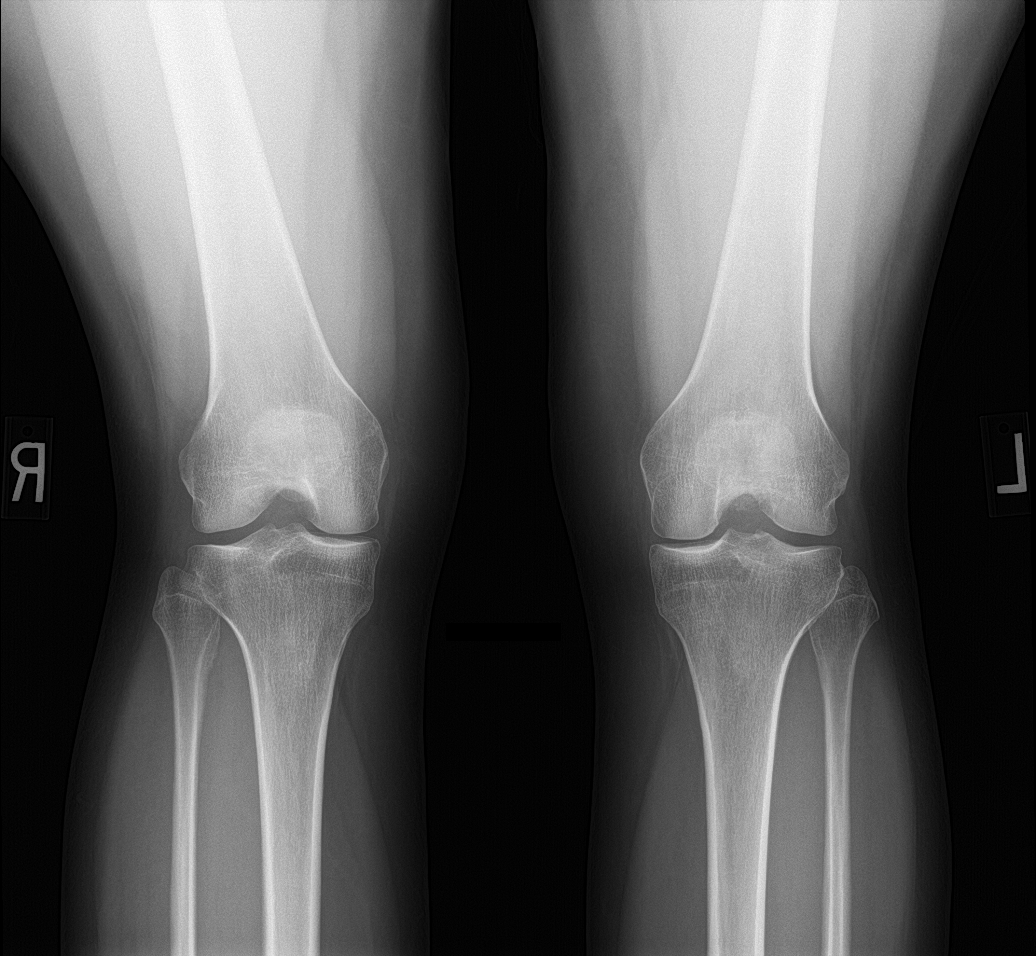

[knee lat]
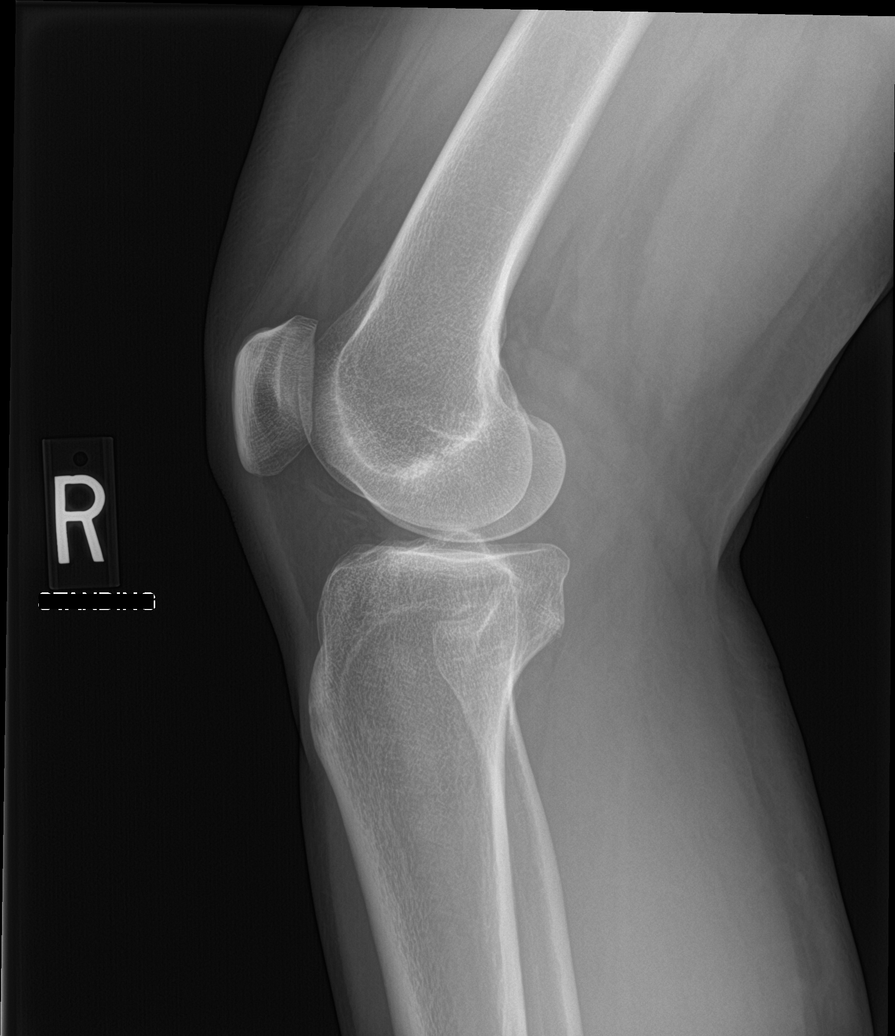

[knee sunrise]
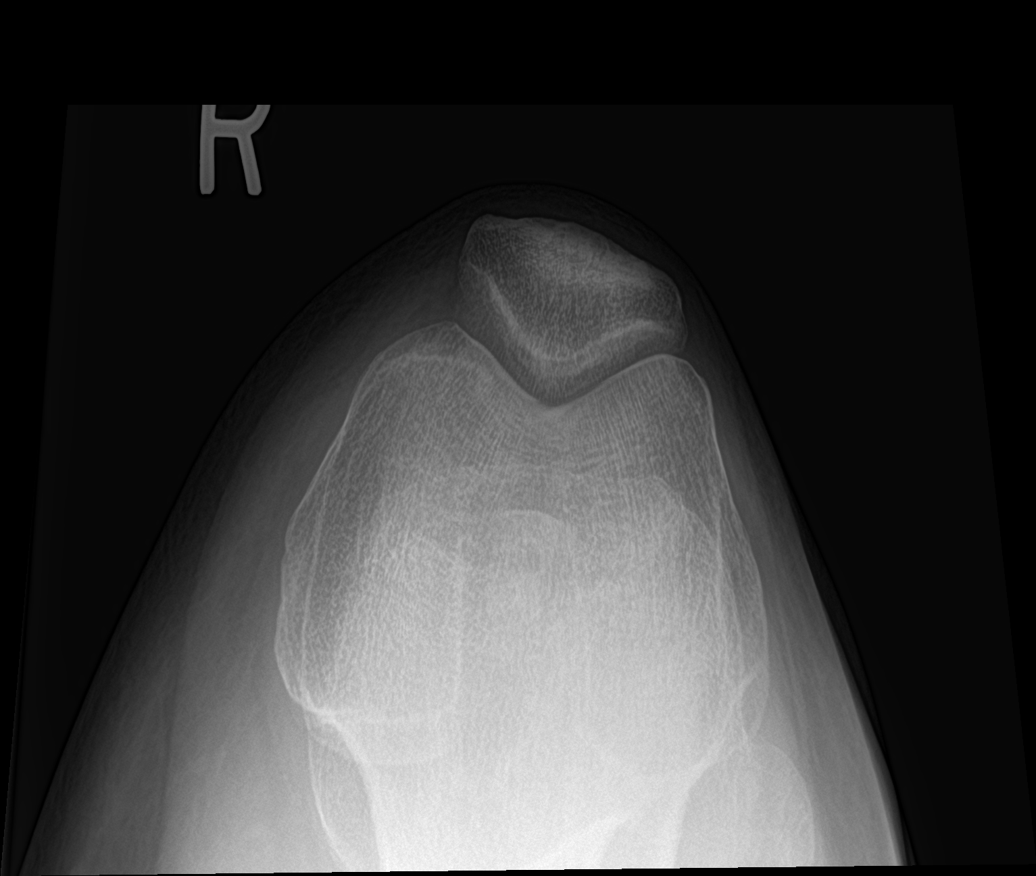

[knee ap bilat standing]
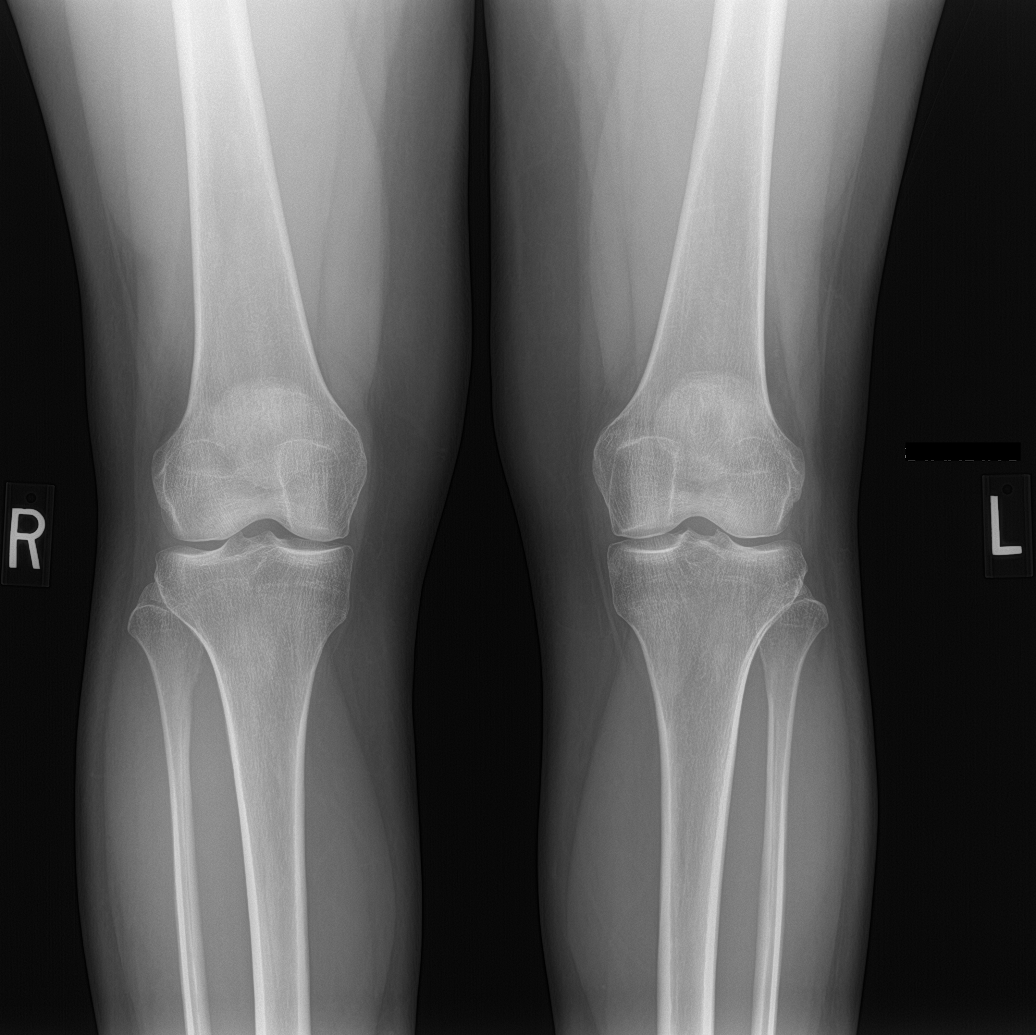

[4 of 4 positions shown; findings below may reference images not displayed]

FINDINGS: Mild medial joint space narrowing is noted. No joint effusion is
seen. No acute fracture or dislocation is noted.
IMPRESSION: Mild degenerative change without acute abnormality.

## 2020-05-17 IMAGING — DX DG KNEE 1-2V*L*
2 series · 2 of 2 positions shown · non-contrast
Comparison: [DATE]

CLINICAL DATA: Comparison view to the right

EXAM:
LEFT KNEE - 2 VIEW

[tunnel]
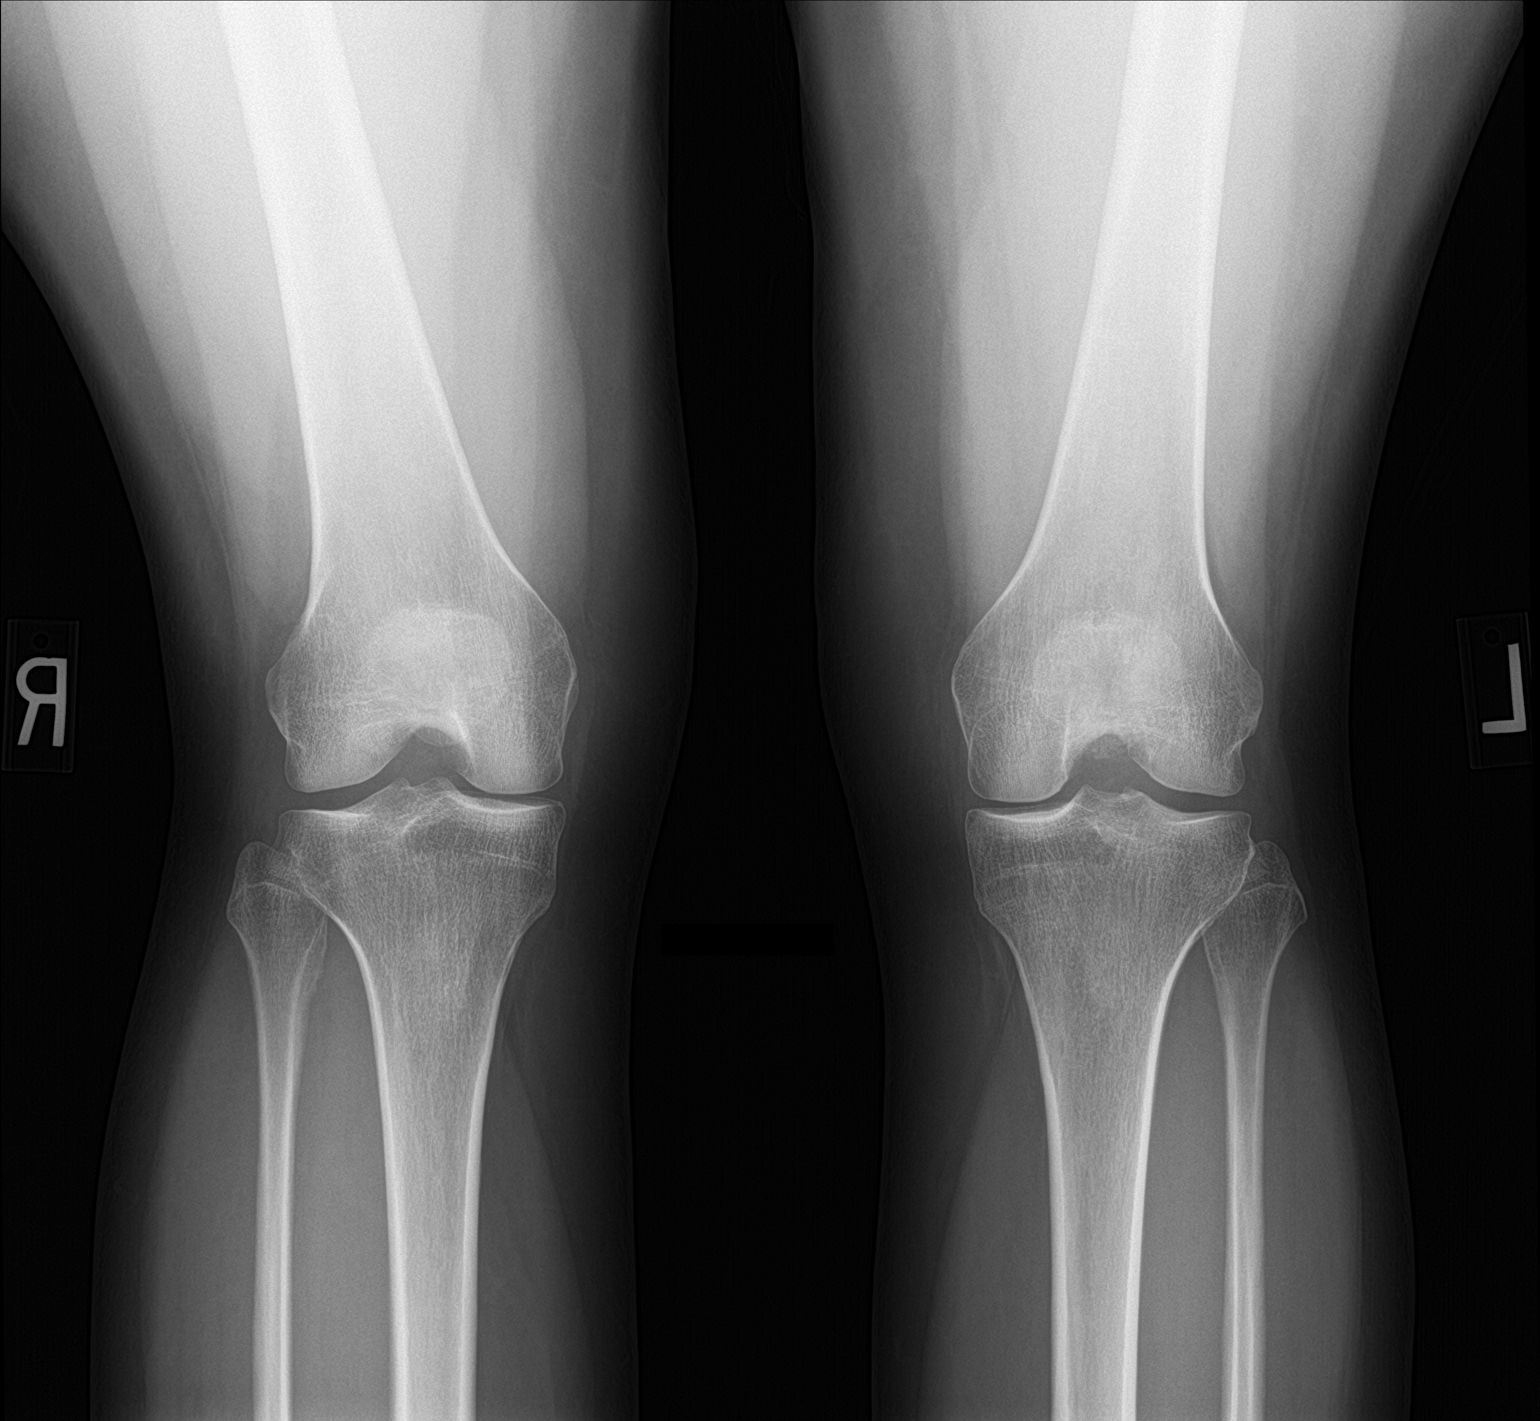

[knee ap bilat standing]
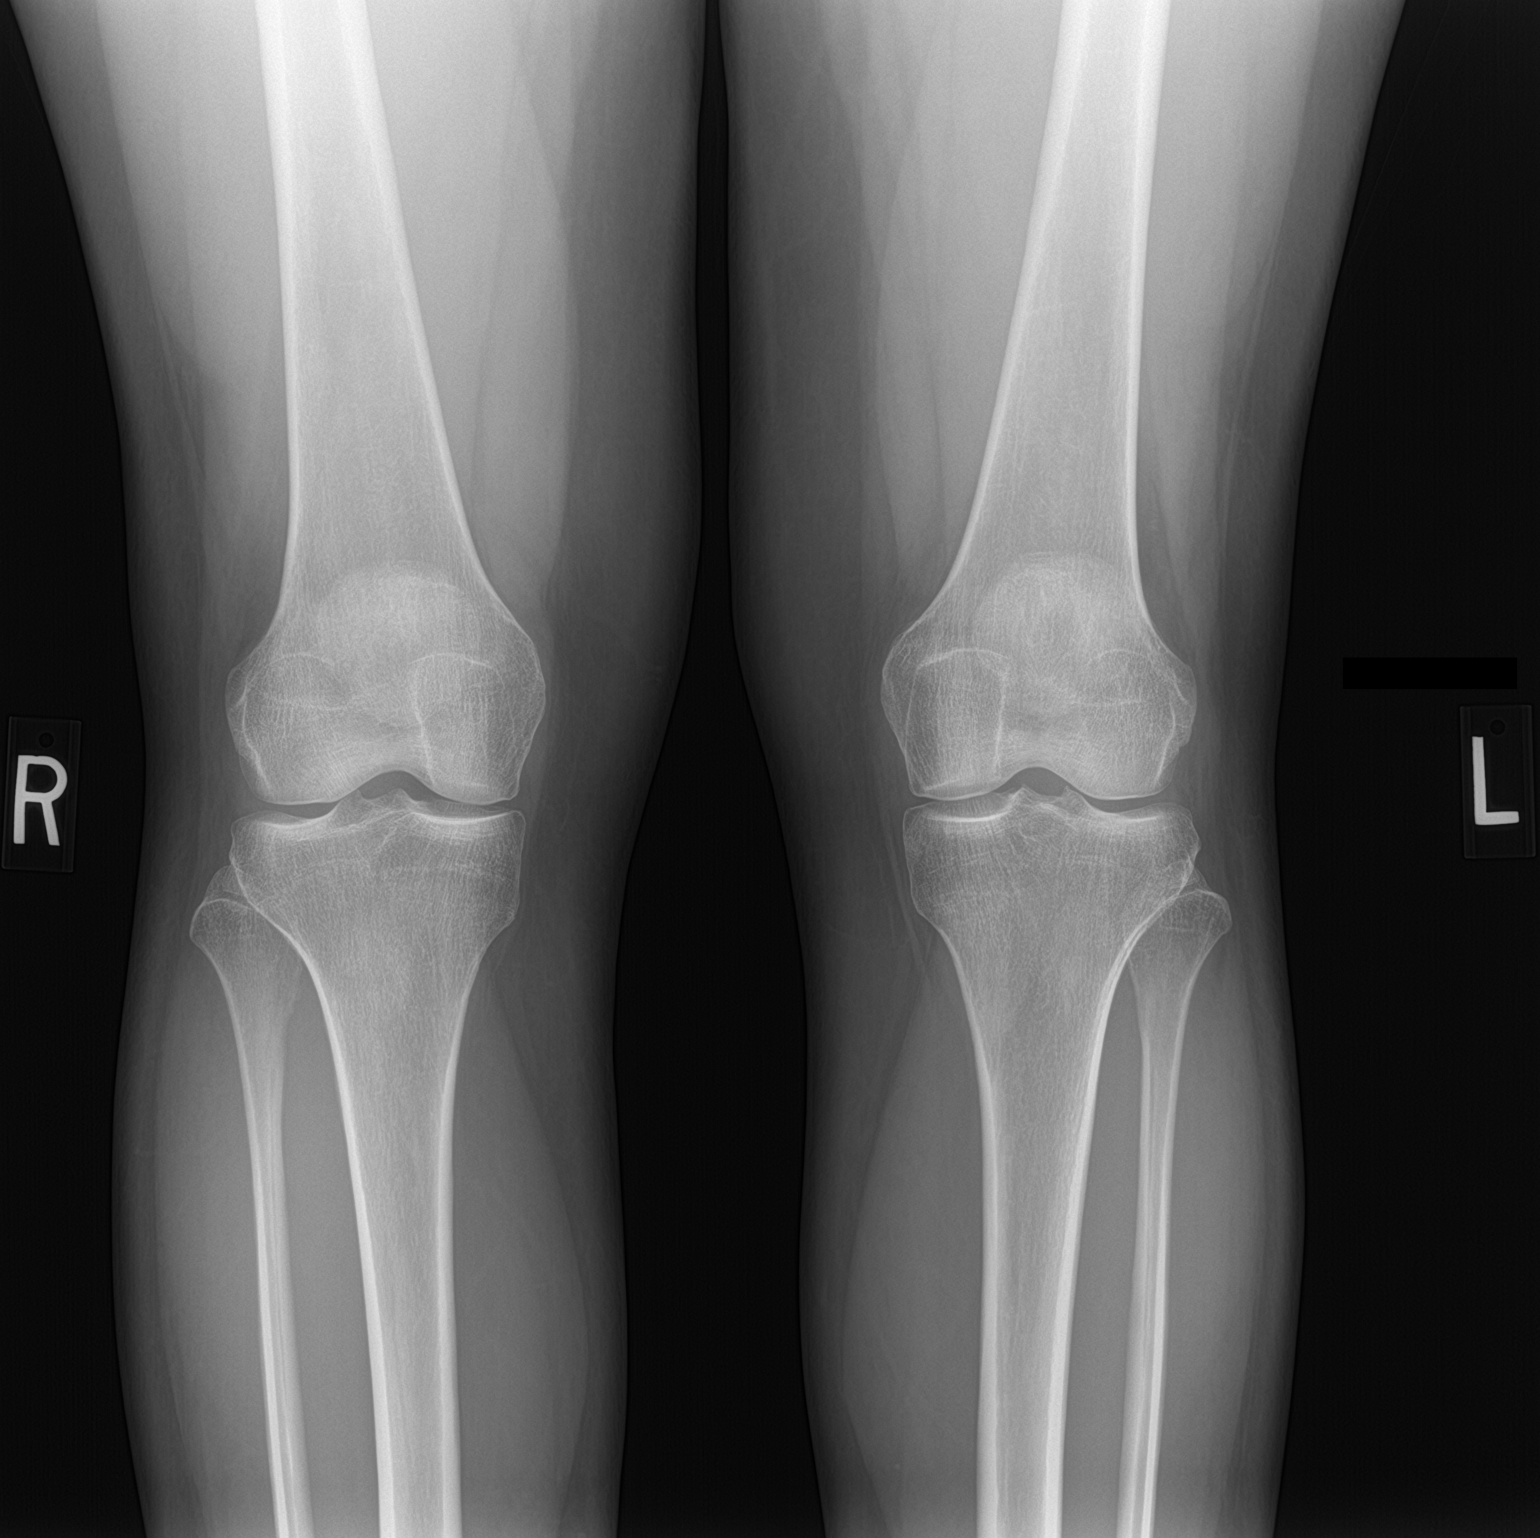

[2 of 2 positions shown; findings below may reference images not displayed]

FINDINGS: Mild medial joint space narrowing is noted similar to that seen on
the right. No acute fracture or dislocation is noted.
IMPRESSION: Mild degenerative change without acute abnormality.

## 2020-05-17 MED ORDER — NAPROXEN 500 MG PO TABS: 500.0000 mg | ORAL_TABLET | Freq: Two times a day (BID) | ORAL | 3 refills | Status: DC

## 2020-05-17 NOTE — Assessment & Plan Note (Signed)
Has been diagnosed with osteoarthritis as an outside facility, we will get her set up for viscosupplementation.

## 2020-05-17 NOTE — Telephone Encounter (Signed)
Please work on bilateral viscosupplementation approval, bilateral x-ray confirmed, failed conservative treatment.

## 2020-05-17 NOTE — Progress Notes (Signed)
    Procedures performed today:    None.  Independent interpretation of notes and tests performed by another provider:   None.  Brief History, Exam, Impression, and Recommendations:    Right knee injury Currently in taekwondo, doing a spin kick 4 days ago, landed awkwardly on her right knee. She developed pain at the medial and lateral joint lines, instability. On exam ligaments are stable with the exception of her ACL, she does have a positive Lachman sign. She does have some mechanical symptoms. Due to this we are going to proceed with x-rays, MRI, she has a brace at home. Naproxen for pain. Return to see me to go over MRI results.  Primary osteoarthritis of both knees Has been diagnosed with osteoarthritis as an outside facility, we will get her set up for viscosupplementation.    ___________________________________________ Ihor Austin. Benjamin Stain, M.D., ABFM., CAQSM. Primary Care and Sports Medicine [facility]  Adjunct Instructor of Family Medicine  [employer]

## 2020-05-17 NOTE — Assessment & Plan Note (Signed)
Currently in taekwondo, doing a spin kick 4 days ago, landed awkwardly on her right knee. She developed pain at the medial and lateral joint lines, instability. On exam ligaments are stable with the exception of her ACL, she does have a positive Lachman sign. She does have some mechanical symptoms. Due to this we are going to proceed with x-rays, MRI, she has a brace at home. Naproxen for pain. Return to see me to go over MRI results.

## 2020-05-22 ENCOUNTER — Other Ambulatory Visit: Payer: Self-pay

## 2020-05-22 ENCOUNTER — Ambulatory Visit (INDEPENDENT_AMBULATORY_CARE_PROVIDER_SITE_OTHER): Payer: BC Managed Care – PPO

## 2020-05-22 DIAGNOSIS — S8991XA Unspecified injury of right lower leg, initial encounter: Secondary | ICD-10-CM

## 2020-05-22 DIAGNOSIS — S83281A Other tear of lateral meniscus, current injury, right knee, initial encounter: Secondary | ICD-10-CM | POA: Diagnosis not present

## 2020-05-22 DIAGNOSIS — Y9375 Activity, martial arts: Secondary | ICD-10-CM

## 2020-05-22 IMAGING — MR MR KNEE*R* W/O CM
7 series · 40 of 40 positions shown · non-contrast
Comparison: Radiographs [DATE] and MRI from [DATE]

CLINICAL DATA: Knee instability, popping and grinding sensation for
1 week. Right knee pain.

EXAM:
MRI OF THE RIGHT KNEE WITHOUT CONTRAST
TECHNIQUE: Multiplanar, multisequence MR imaging of the knee was performed. No
intravenous contrast was administered.

[Series 4: T2 fat-sat · axial · 4.0mm · 0.53mm/px · z∈[-85,+85]mm · 7 of 35 slices shown (1 of 3)]
[im 1/35]
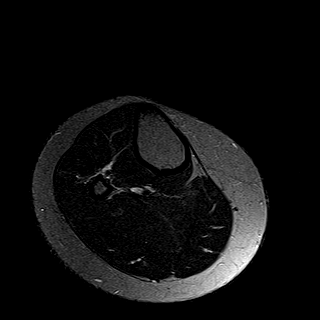
[im 6/35]
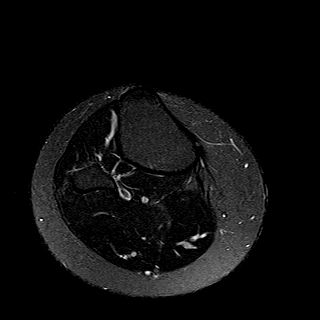
[im 12/35]
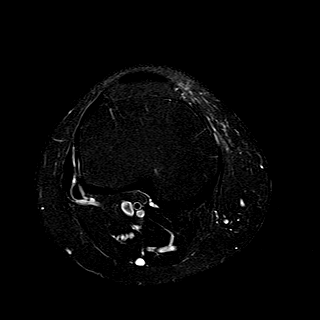
[im 18/35]
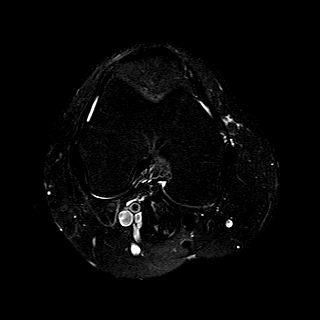
[im 23/35]
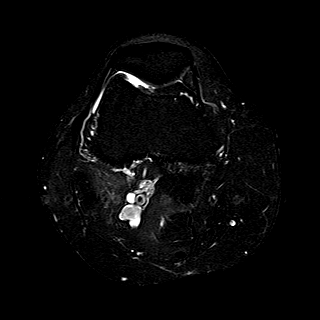
[im 29/35]
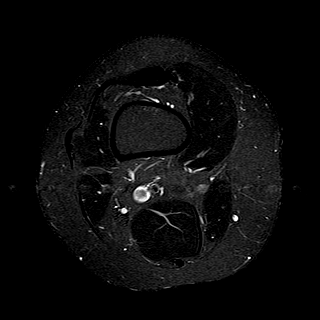
[im 35/35]
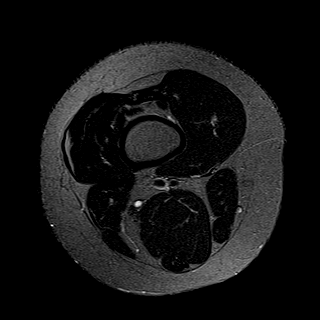

[Series 6: T1 · coronal · 4.0mm · 0.62mm/px · 5 of 27 slices shown]
[im 1/27]
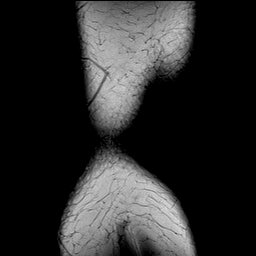
[im 7/27]
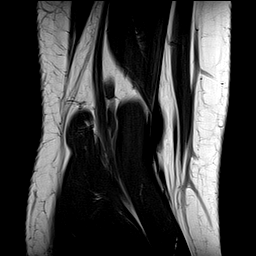
[im 14/27]
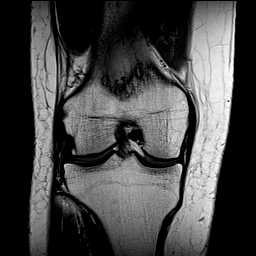
[im 20/27]
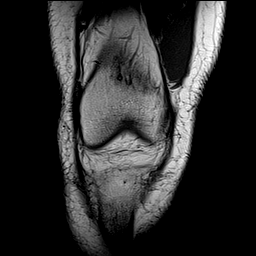
[im 27/27]
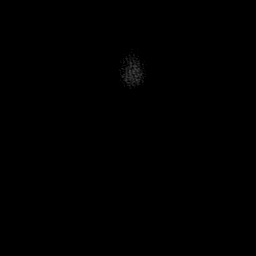

[Series 7: T2 fat-sat · coronal · 4.0mm · 0.62mm/px · 5 of 27 slices shown (2 of 3)]
[im 1/27]
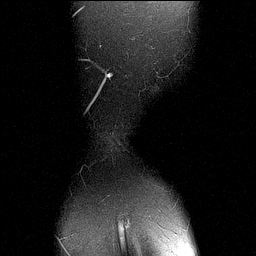
[im 7/27]
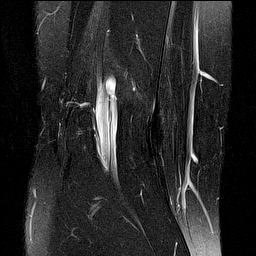
[im 14/27]
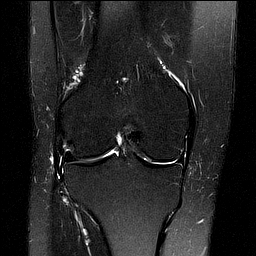
[im 20/27]
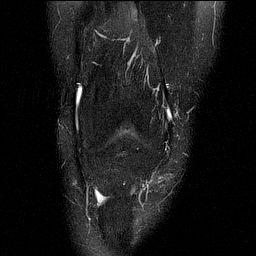
[im 27/27]
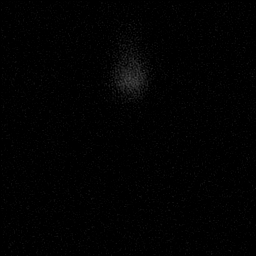

[Series 8: PD fat-sat · coronal · 4.0mm · 0.62mm/px · 5 of 27 slices shown (1 of 3)]
[im 1/27]
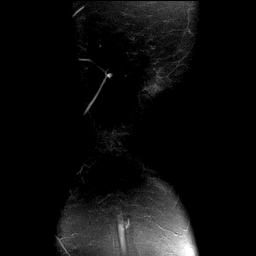
[im 7/27]
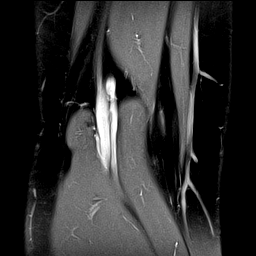
[im 14/27]
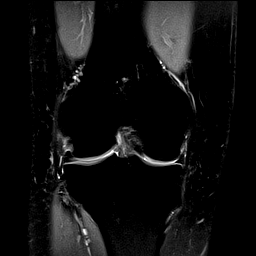
[im 20/27]
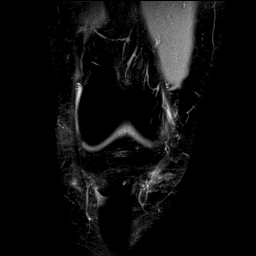
[im 27/27]
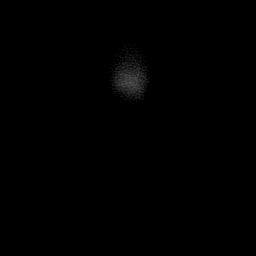

[Series 9: PD fat-sat · sagittal · 3.0mm · 0.62mm/px · 7 of 34 slices shown (2 of 3)]
[im 1/34]
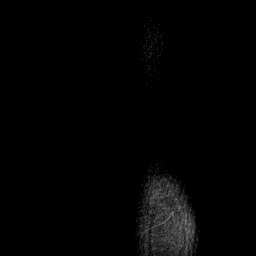
[im 6/34]
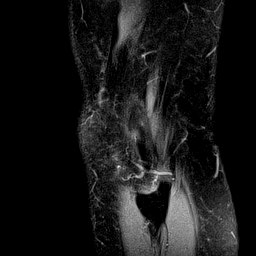
[im 12/34]
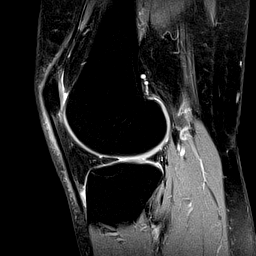
[im 17/34]
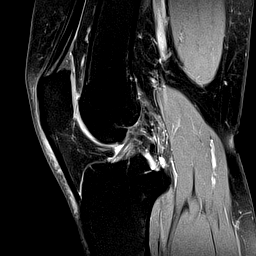
[im 23/34]
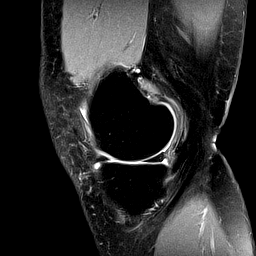
[im 28/34]
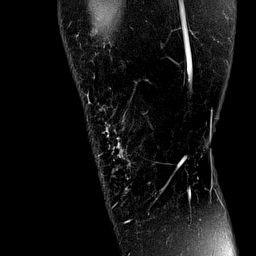
[im 34/34]
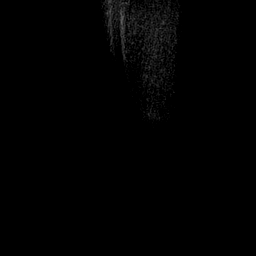

[Series 10: T2 fat-sat · sagittal · 3.0mm · 0.62mm/px · 7 of 34 slices shown (3 of 3)]
[im 1/34]
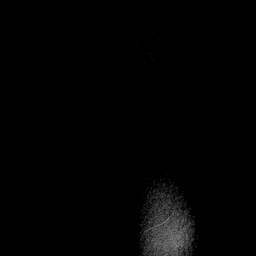
[im 6/34]
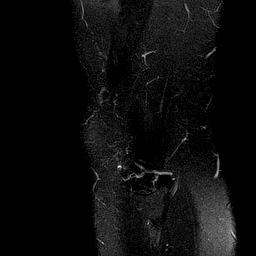
[im 12/34]
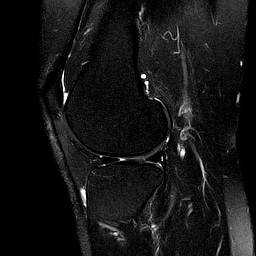
[im 17/34]
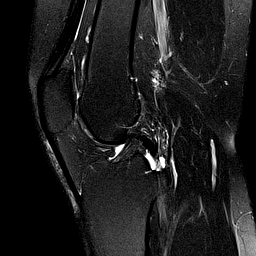
[im 23/34]
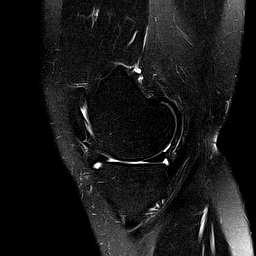
[im 28/34]
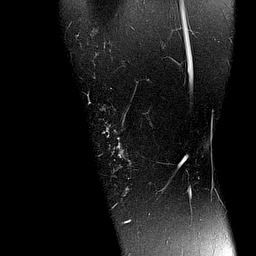
[im 34/34]
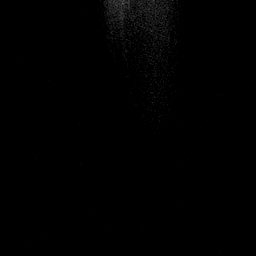

[Series 11: PD fat-sat · coronal · 2.0mm · 0.62mm/px · 4 of 19 slices shown (3 of 3)]
[im 1/19]
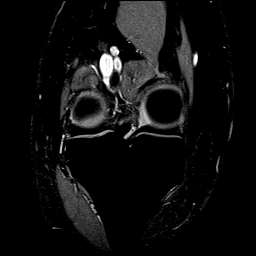
[im 7/19]
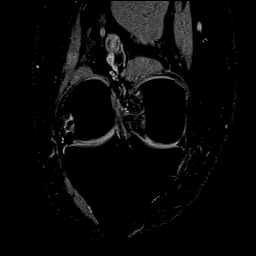
[im 13/19]
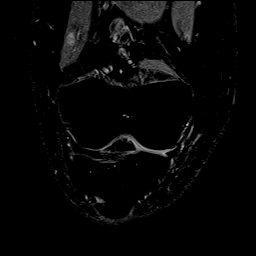
[im 19/19]
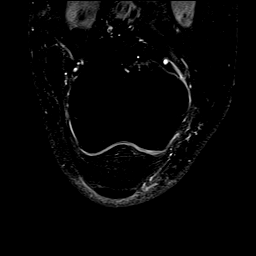

[40 of 40 positions shown; findings below may reference images not displayed]

FINDINGS: MENISCI

Medial meniscus:  Unremarkable

Lateral meniscus: Small free edge tear of the midbody lateral
meniscus as shown on image 7 series 11.

LIGAMENTS

Cruciates:  Unremarkable

Collaterals:  Unremarkable

CARTILAGE

Patellofemoral:  Unremarkable

Medial:  Mild degenerative chondral thinning.

Lateral:  Unremarkable

Joint: Upper normal amount of fluid in the knee joint. No overt
effusion.

Popliteal Fossa:  Unremarkable

Extensor Mechanism:  Unremarkable

Bones: No significant extra-articular osseous abnormalities
identified.

Other: No supplemental non-categorized findings.
IMPRESSION: 1. Small free edge tear of the midbody lateral meniscus.
2. Mild degenerative chondral thinning in the medial compartment
similar to the prior exam.

## 2020-05-25 ENCOUNTER — Other Ambulatory Visit: Payer: Self-pay

## 2020-05-25 ENCOUNTER — Ambulatory Visit (INDEPENDENT_AMBULATORY_CARE_PROVIDER_SITE_OTHER): Payer: BC Managed Care – PPO | Admitting: Sports Medicine

## 2020-05-25 ENCOUNTER — Ambulatory Visit: Payer: Self-pay

## 2020-05-25 DIAGNOSIS — S83206D Unspecified tear of unspecified meniscus, current injury, right knee, subsequent encounter: Secondary | ICD-10-CM

## 2020-05-25 NOTE — Progress Notes (Signed)
    Procedures performed today:    Procedure: Real-time Ultrasound Guided injection of the right knee Device: Samsung [id number]  Verbal informed consent obtained.  Time-out conducted.  Noted no overlying erythema, induration, or other signs of local infection.  Skin prepped in a sterile fashion.  Local anesthesia: Topical Ethyl chloride.  With sterile technique and under real time ultrasound guidance:  Noted trace effusion, 1 cc Kenalog 40, 2 cc lidocaine, 2 cc bupivacaine injected easily Completed without difficulty  Advised to call if fevers/chills, erythema, induration, drainage, or persistent bleeding.  Images permanently stored and available for review in PACS.  Impression: Technically successful ultrasound guided injection.  Independent interpretation of notes and tests performed by another provider:   None.  Brief History, Exam, Impression, and Recommendations:    Acute meniscal tear of right knee [name] is a pleasant 40 year old female, she was in taekwondo, did a spin kick landed awkwardly, and had pain at the medial joint line. MRI ultimately did show a small lateral meniscal tear, today we injected her knee with ultrasound guidance for pain relief but she understands that due to having some mechanical symptoms she will probably still need an arthroscopy. She had a good experience with Dr. Emelda Fear with [facility] we will try to get her back in with Dr. Emelda Fear.    ___________________________________________ Ihor Austin. Benjamin Stain, M.D., ABFM., CAQSM. Primary Care and Sports Medicine [facility]  Adjunct Instructor of Family Medicine  [employer]

## 2020-05-25 NOTE — Telephone Encounter (Signed)
Started Orthovisc process waiting on BID and to see if PA is required. - CF 

## 2020-05-25 NOTE — Assessment & Plan Note (Addendum)
Denise Huff is a pleasant 40 year old female, she was in taekwondo, did a spin kick landed awkwardly, and had pain at the medial joint line. MRI ultimately did show a small lateral meniscal tear, today we injected her knee with ultrasound guidance for pain relief but she understands that due to having some mechanical symptoms she will probably still need an arthroscopy. She had a good experience with Dr. Emelda Fear with [facility] we will try to get her back in with Dr. Emelda Fear.

## 2020-06-07 ENCOUNTER — Ambulatory Visit: Payer: BC Managed Care – PPO | Admitting: Family Medicine

## 2020-06-07 ENCOUNTER — Other Ambulatory Visit: Payer: Self-pay

## 2020-06-07 VITALS — BP 118/57 | HR 79 | Ht 67.0 in | Wt 180.0 lb

## 2020-06-07 DIAGNOSIS — R002 Palpitations: Secondary | ICD-10-CM

## 2020-06-07 DIAGNOSIS — R5383 Other fatigue: Secondary | ICD-10-CM

## 2020-06-07 DIAGNOSIS — G4710 Hypersomnia, unspecified: Secondary | ICD-10-CM | POA: Diagnosis not present

## 2020-06-07 DIAGNOSIS — G4719 Other hypersomnia: Secondary | ICD-10-CM

## 2020-06-07 NOTE — Progress Notes (Signed)
Established Patient Office Visit  Subjective:  Patient ID: Denise Huff, female    DOB: March 01, 1980  Age: 40 y.o. MRN: [id number]  CC:  Chief Complaint  Patient presents with  . Fatigue    HPI Denise Huff presents for fatigue - 2 mo f/u.    She is doing well overall.  She says that the persistent cough she was struggling with last saw her last time after a viral illness in January seems to have completely resolved so that is much much better.  Follow-up PACs and PVCs.  She is actually been feeling much better since being on the beta-blocker she says the episodes have been much and more infrequent and less intense and much shorter.  She said she had a few episodes this week which was a little unusual for her but again they were much more brief and less intense.  He unfortunately injured her right knee and is going to be scheduled soon for surgical repair of the meniscus.  Still experiencing chronic fatigue and is still struggling with that she feels like she could fall asleep very easily during the daytime.  She has really worked on good sleep hygiene setting a standard bedtime, decreasing her caffeine intake, avoiding screen time before bedtime.  Trying to make sure that the bedroom is conducive to sleep.  She denies any snoring or excessive tossing and turning.   No past medical history on file.  Past Surgical History:  Procedure Laterality Date  . APPENDECTOMY    . CESAREAN SECTION    . KNEE SURGERY    . TONSILLECTOMY    . TYMPANOSTOMY TUBE PLACEMENT      Family History  Problem Relation Age of Onset  . Skin cancer Mother   . Depression Mother   . CAD Father 34       triple bypass   . Hypertension Father   . Skin cancer Father   . Diabetes Father   . Polycystic ovary syndrome Sister   . Depression Sister   . Breast cancer Maternal Aunt     Social History   Socioeconomic History  . Marital status: Married    Spouse name: Denise Huff  . Number of children:  2  . Years of education: College   . Highest education level: Not on file  Occupational History  . Occupation: Runner, broadcasting/film/video    Comment: Middle sCHOOL  Tobacco Use  . Smoking status: Never Smoker  . Smokeless tobacco: Never Used  Vaping Use  . Vaping Use: Never used  Substance and Sexual Activity  . Alcohol use: Yes    Comment: rarely  . Drug use: Never  . Sexual activity: Yes    Partners: Male    Birth control/protection: Condom  Other Topics Concern  . Not on file  Social History Narrative  . Not on file   Social Determinants of Health   Financial Resource Strain: Not on file  Food Insecurity: Not on file  Transportation Needs: Not on file  Physical Activity: Not on file  Stress: Not on file  Social Connections: Not on file  Intimate Partner Violence: Not on file    Outpatient Medications Prior to Visit  Medication Sig Dispense Refill  . ALPRAZolam (XANAX) 0.25 MG tablet Take 1 tablet (0.25 mg total) by mouth at bedtime as needed for anxiety or sleep. 30 tablet 1  . atorvastatin (LIPITOR) 20 MG tablet TAKE 1 TABLET BY MOUTH EVERYDAY AT BEDTIME 90 tablet 3  . cyanocobalamin (,VITAMIN  B-12,) 1000 MCG/ML injection INJECT 1 ML INTO THE MUSCLE ONCE A WEEK. 12 mL 1  . JUNEL FE 1/20 1-20 MG-MCG tablet Take 1 tablet by mouth daily.  4  . metoprolol succinate (TOPROL-XL) 25 MG 24 hr tablet Take 1 tablet (25 mg total) by mouth daily. 30 tablet 2  . naproxen (NAPROSYN) 500 MG tablet Take 1 tablet (500 mg total) by mouth 2 (two) times daily with a meal. 60 tablet 3  . sertraline (ZOLOFT) 100 MG tablet TAKE 1.5 TABLETS BY MOUTH DAILY 135 tablet 1   No facility-administered medications prior to visit.    No Known Allergies  ROS Review of Systems    Objective:    Physical Exam Vitals reviewed.  Constitutional:      Appearance: She is well-developed.  HENT:     Head: Normocephalic and atraumatic.  Eyes:     Conjunctiva/sclera: Conjunctivae normal.  Cardiovascular:     Rate  and Rhythm: Normal rate.  Pulmonary:     Effort: Pulmonary effort is normal.  Skin:    General: Skin is dry.     Coloration: Skin is not pale.  Neurological:     Mental Status: She is alert and oriented to person, place, and time.  Psychiatric:        Behavior: Behavior normal.     BP (!) 118/57   Pulse 79   Ht 5\' 7"  (1.702 m)   Wt 180 lb (81.6 kg)   SpO2 97%   BMI 28.19 kg/m  Wt Readings from Last 3 Encounters:  06/07/20 180 lb (81.6 kg)  04/04/20 177 lb (80.3 kg)  10/05/19 184 lb (83.5 kg)     Health Maintenance Due  Topic Date Due  . HIV Screening  Never done  . PAP SMEAR-Modifier  08/04/2020    There are no preventive care reminders to display for this patient.  Lab Results  Component Value Date   TSH 1.08 04/11/2020   Lab Results  Component Value Date   WBC 5.9 04/11/2020   HGB 13.5 04/11/2020   HCT 39.4 04/11/2020   MCV 92.3 04/11/2020   PLT 279 04/11/2020   Lab Results  Component Value Date   NA 141 04/11/2020   K 4.3 04/11/2020   CO2 30 04/11/2020   GLUCOSE 89 04/11/2020   BUN 14 04/11/2020   CREATININE 0.79 04/11/2020   BILITOT 0.7 09/23/2019   AST 13 09/23/2019   ALT 15 09/23/2019   PROT 7.0 09/23/2019   CALCIUM 9.4 04/11/2020   Lab Results  Component Value Date   CHOL 163 09/23/2019   Lab Results  Component Value Date   HDL 46 (L) 09/23/2019   Lab Results  Component Value Date   LDLCALC 85 09/23/2019   Lab Results  Component Value Date   TRIG 234 (H) 09/23/2019   Lab Results  Component Value Date   CHOLHDL 3.5 09/23/2019   Lab Results  Component Value Date   HGBA1C 5.2 04/03/2018      Assessment & Plan:   Problem List Items Addressed This Visit      Other   Palpitations    Improved with the addition of a beta-blocker.  We will follow-up in a couple months to see if she wants to continue it or discontinue at that time.  This summer might be a good time to try going without it continue to minimize caffeine intake.       Hypersomnolence    She really has no snoring  to indicated OSA or periodic limb movement.  She hs really working on sleep hygiene with no improvement. She feels like the need to sleep can be overwhelming at time and when she sleeps more 10 hours or more she doesn't feel well rested.        Fatigue    Still consider Chronic fatigue, though I want to work up the hypersomnolence first.         Other Visit Diagnoses    Excessive daytime sleepiness    -  Primary   Relevant Orders   Ambulatory referral to Neurology      No orders of the defined types were placed in this encounter.   Follow-up: Return in about 2 months (around 08/07/2020) for follow up on betablocker.    Nani Gasser, MD

## 2020-06-07 NOTE — Assessment & Plan Note (Signed)
Still consider Chronic fatigue, though I want to work up the hypersomnolence first.

## 2020-06-07 NOTE — Assessment & Plan Note (Signed)
She really has no snoring to indicated OSA or periodic limb movement.  She hs really working on sleep hygiene with no improvement. She feels like the need to sleep can be overwhelming at time and when she sleeps more 10 hours or more she doesn't feel well rested.

## 2020-06-07 NOTE — Assessment & Plan Note (Signed)
Improved with the addition of a beta-blocker.  We will follow-up in a couple months to see if she wants to continue it or discontinue at that time.  This summer might be a good time to try going without it continue to minimize caffeine intake.

## 2020-06-22 ENCOUNTER — Ambulatory Visit: Payer: BC Managed Care – PPO | Admitting: Sports Medicine

## 2020-07-21 NOTE — Telephone Encounter (Signed)
Received BID and although a Prior Auth is not required for the injection a predetermination is so the procedure will not be denied and a PA is required for the medication. A 20% copay is due each visit  I will call patient and let her know. - CF

## 2020-07-22 ENCOUNTER — Other Ambulatory Visit: Payer: Self-pay | Admitting: Family Medicine

## 2020-08-24 ENCOUNTER — Other Ambulatory Visit: Payer: Self-pay

## 2020-08-24 ENCOUNTER — Ambulatory Visit (INDEPENDENT_AMBULATORY_CARE_PROVIDER_SITE_OTHER): Payer: BC Managed Care – PPO | Admitting: Sports Medicine

## 2020-08-24 ENCOUNTER — Encounter: Payer: Self-pay | Admitting: Sports Medicine

## 2020-08-24 DIAGNOSIS — L918 Other hypertrophic disorders of the skin: Secondary | ICD-10-CM

## 2020-08-24 NOTE — Progress Notes (Signed)
    Procedures performed today:    Procedure:  Cryodestruction of approximately 15 skin tags on the arms, shoulders, right face, abdomen, back, thighs. Consent obtained and verified. Time-out conducted. Noted no overlying erythema, induration, or other signs of local infection. Completed without difficulty using Cryo-Gun. Advised to call if fevers/chills, erythema, induration, drainage, or persistent bleeding.  Independent interpretation of notes and tests performed by another provider:   None.  Brief History, Exam, Impression, and Recommendations:    Acrochordon This is a very pleasant 40 year old female, she has multiple skin tags on her face, back, shoulders, thighs. Today I performed cryotherapy of approximately 15 skin tags across the above locations. Return to see me as needed in 2 weeks for repeat treatment. I was very gentle on the skin tag on her face so this will most likely need retreatment.    ___________________________________________ Ihor Austin. Benjamin Stain, M.D., ABFM., CAQSM. Primary Care and Sports Medicine [place] [facility]  Adjunct Instructor of Family Medicine  [employer]

## 2020-08-24 NOTE — Assessment & Plan Note (Signed)
This is a very pleasant 40 year old female, she has multiple skin tags on her face, back, shoulders, thighs. Today I performed cryotherapy of approximately 15 skin tags across the above locations. Return to see me as needed in 2 weeks for repeat treatment. I was very gentle on the skin tag on her face so this will most likely need retreatment.

## 2020-08-29 ENCOUNTER — Other Ambulatory Visit: Payer: Self-pay | Admitting: Family Medicine

## 2020-08-29 DIAGNOSIS — D510 Vitamin B12 deficiency anemia due to intrinsic factor deficiency: Secondary | ICD-10-CM

## 2020-09-08 ENCOUNTER — Ambulatory Visit: Payer: BC Managed Care – PPO | Admitting: Family Medicine

## 2020-09-09 ENCOUNTER — Other Ambulatory Visit: Payer: Self-pay

## 2020-09-09 ENCOUNTER — Ambulatory Visit (INDEPENDENT_AMBULATORY_CARE_PROVIDER_SITE_OTHER): Payer: BC Managed Care – PPO | Admitting: Sports Medicine

## 2020-09-09 DIAGNOSIS — L918 Other hypertrophic disorders of the skin: Secondary | ICD-10-CM | POA: Diagnosis not present

## 2020-09-09 NOTE — Progress Notes (Signed)
    Procedures performed today:    Procedure:  Cryodestruction of #7 skin tags on the abdomen, thighs, shoulder, face Consent obtained and verified. Time-out conducted. Noted no overlying erythema, induration, or other signs of local infection. Completed without difficulty using Cryo-Gun. Advised to call if fevers/chills, erythema, induration, drainage, or persistent bleeding.  Independent interpretation of notes and tests performed by another provider:   None.  Brief History, Exam, Impression, and Recommendations:    Acrochordon This is a very pleasant 40 year old female, multiple skin tags, at the last visit we did cryotherapy on approximately 15 skin tags across her abdomen, thighs, back, face. Most of them have completely resorbed, approximately 6 or 7 needed retreatment, but otherwise things look excellent, return as needed.    ___________________________________________ Ihor Austin. Benjamin Stain, M.D., ABFM., CAQSM. Primary Care and Sports Medicine [place] [facility]  Adjunct Instructor of Family Medicine  [employer]

## 2020-09-09 NOTE — Assessment & Plan Note (Signed)
This is a very pleasant 40 year old female, multiple skin tags, at the last visit we did cryotherapy on approximately 15 skin tags across her abdomen, thighs, back, face. Most of them have completely resorbed, approximately 6 or 7 needed retreatment, but otherwise things look excellent, return as needed.

## 2020-09-15 ENCOUNTER — Other Ambulatory Visit: Payer: Self-pay | Admitting: Family Medicine

## 2020-10-25 ENCOUNTER — Other Ambulatory Visit: Payer: Self-pay | Admitting: Family Medicine

## 2020-10-25 NOTE — Telephone Encounter (Signed)
Please call pt she will need to have fasting labs done for refill on Atorvastatin.

## 2020-10-26 NOTE — Telephone Encounter (Signed)
LVM informing patient that she is due for fasting labs for refill on medication. AM

## 2020-11-14 ENCOUNTER — Other Ambulatory Visit: Payer: Self-pay | Admitting: *Deleted

## 2020-11-14 ENCOUNTER — Telehealth: Payer: Self-pay | Admitting: Family Medicine

## 2020-11-14 DIAGNOSIS — D510 Vitamin B12 deficiency anemia due to intrinsic factor deficiency: Secondary | ICD-10-CM

## 2020-11-14 DIAGNOSIS — E782 Mixed hyperlipidemia: Secondary | ICD-10-CM

## 2020-11-14 NOTE — Telephone Encounter (Signed)
Pt called. Pt was told to schedule fasting lab appt but lab Order not in computer.

## 2020-11-14 NOTE — Telephone Encounter (Signed)
LVM advising pt that labs have been ordered.

## 2020-11-22 LAB — COMPLETE METABOLIC PANEL WITH GFR
AG Ratio: 1.8 (calc) (ref 1.0–2.5)
ALT: 19 U/L (ref 6–29)
AST: 14 U/L (ref 10–30)
Albumin: 4.2 g/dL (ref 3.6–5.1)
Alkaline phosphatase (APISO): 47 U/L (ref 31–125)
BUN: 15 mg/dL (ref 7–25)
CO2: 29 mmol/L (ref 20–32)
Calcium: 9.4 mg/dL (ref 8.6–10.2)
Chloride: 103 mmol/L (ref 98–110)
Creat: 0.77 mg/dL (ref 0.50–0.97)
Globulin: 2.3 g/dL (calc) (ref 1.9–3.7)
Glucose, Bld: 89 mg/dL (ref 65–99)
Potassium: 4.4 mmol/L (ref 3.5–5.3)
Sodium: 140 mmol/L (ref 135–146)
Total Bilirubin: 0.4 mg/dL (ref 0.2–1.2)
Total Protein: 6.5 g/dL (ref 6.1–8.1)
eGFR: 101 mL/min/{1.73_m2} (ref >=60)

## 2020-11-22 LAB — VITAMIN B12: Vitamin B-12: 680 pg/mL (ref 200–1100)

## 2020-11-22 LAB — LIPID PANEL W/REFLEX DIRECT LDL
Cholesterol: 180 mg/dL (ref <200)
HDL: 54 mg/dL (ref >=50)
LDL Cholesterol (Calc): 97 mg/dL (calc)
Non-HDL Cholesterol (Calc): 126 mg/dL (calc) (ref <130)
Total CHOL/HDL Ratio: 3.3 (calc) (ref <5.0)
Triglycerides: 192 mg/dL — ABNORMAL HIGH (ref <150)

## 2020-11-23 NOTE — Progress Notes (Signed)
Hi Sukhman, LDL cholesterol looks great.  Triglycerides are still mildly elevated but better than last year so great work and bringing that down.  Your metabolic panel looks great.  B12 is also normal.  Also just encourage you to schedule your Pap smear if you have not and to also consider getting your flu shot this fall.

## 2020-12-02 ENCOUNTER — Other Ambulatory Visit: Payer: Self-pay | Admitting: Family Medicine

## 2020-12-14 ENCOUNTER — Other Ambulatory Visit: Payer: Self-pay

## 2020-12-14 ENCOUNTER — Emergency Department: Admit: 2020-12-14 | Payer: Self-pay

## 2020-12-14 ENCOUNTER — Emergency Department
Admission: EM | Admit: 2020-12-14 | Discharge: 2020-12-14 | Disposition: A | Discharge status: Home / Self Care | Payer: BC Managed Care – PPO | Source: Home / Self Care

## 2020-12-14 ENCOUNTER — Encounter: Payer: Self-pay | Admitting: Emergency Medicine

## 2020-12-14 DIAGNOSIS — J309 Allergic rhinitis, unspecified: Secondary | ICD-10-CM

## 2020-12-14 DIAGNOSIS — R5383 Other fatigue: Secondary | ICD-10-CM

## 2020-12-14 DIAGNOSIS — B349 Viral infection, unspecified: Secondary | ICD-10-CM

## 2020-12-14 NOTE — ED Provider Notes (Signed)
[facility]    CSN: [id number] Arrival date & time: 12/14/20  1802      History   Chief Complaint Chief Complaint  Patient presents with   Nasal Congestion    HPI Denise Huff is a 40 y.o. female.   HPI 40 year old female presents with nasal congestion, chills, runny nose, diarrhea, dizziness, fatigue x 2 days.  PMH significant for chronic sore throat and fatigue.  History reviewed. No pertinent past medical history.  Patient Active Problem List   Diagnosis Date Noted   Hypersomnolence 06/07/2020   Acute meniscal tear of right knee 05/17/2020   Fatigue 04/04/2020   Palpitations 04/04/2020   Acrochordon 05/19/2018   Primary osteoarthritis of both knees 10/28/2017   Mixed hyperlipidemia 10/28/2017   Depression with anxiety 10/28/2017   Pernicious anemia 11/27/2016   Atrophy of muscle of left thigh 11/27/2016   Venous insufficiency of left leg 11/27/2016   Sore throat, chronic 12/03/2013   S/P laparoscopic appendectomy 11/01/2010    Past Surgical History:  Procedure Laterality Date   APPENDECTOMY     CESAREAN SECTION     KNEE SURGERY     TONSILLECTOMY     TYMPANOSTOMY TUBE PLACEMENT      OB History   No obstetric history on file.      Home Medications    Prior to Admission medications   Medication Sig Start Date End Date Taking? Authorizing Provider  ALPRAZolam (XANAX) 0.25 MG tablet Take 1 tablet (0.25 mg total) by mouth at bedtime as needed for anxiety or sleep. 11/25/18   Agapito Games, MD  atorvastatin (LIPITOR) 20 MG tablet Take 1 tablet (20 mg total) by mouth daily. 12/02/20   Agapito Games, MD  cyanocobalamin (,VITAMIN B-12,) 1000 MCG/ML injection INJECT 1 ML INTO THE MUSCLE ONCE A WEEK. 08/30/20   Agapito Games, MD  JUNEL FE 1/20 1-20 MG-MCG tablet Take 1 tablet by mouth daily. 03/21/20   Cox, Ronnald Collum, MD  metoprolol succinate (TOPROL-XL) 25 MG 24 hr tablet TAKE 1 TABLET (25 MG TOTAL) BY MOUTH DAILY. 07/22/20    Agapito Games, MD  sertraline (ZOLOFT) 100 MG tablet TAKE 1.5 TABLETS BY MOUTH DAILY 01/27/19   Agapito Games, MD    Family History Family History  Problem Relation Age of Onset   Skin cancer Mother    Depression Mother    CAD Father 53       triple bypass    Hypertension Father    Skin cancer Father    Diabetes Father    Polycystic ovary syndrome Sister    Depression Sister    Breast cancer Maternal Aunt     Social History Social History   Tobacco Use   Smoking status: Never   Smokeless tobacco: Never  Vaping Use   Vaping Use: Never used  Substance Use Topics   Alcohol use: Yes    Comment: rarely   Drug use: Never     Allergies   Patient has no known allergies.   Review of Systems Review of Systems  Constitutional:  Positive for fatigue.  HENT:  Positive for congestion, rhinorrhea and sore throat.   All other systems reviewed and are negative.   Physical Exam Triage Vital Signs ED Triage Vitals  Enc Vitals Group     BP 12/14/20 1825 119/82     Pulse Rate 12/14/20 1825 75     Resp 12/14/20 1825 18     Temp 12/14/20 1825 98.2 F (  36.8 C)     Temp Source 12/14/20 1825 Oral     SpO2 12/14/20 1825 97 %     Weight 12/14/20 1826 187 lb (84.8 kg)     Height 12/14/20 1826 5\' 7"  (1.702 m)     Head Circumference --      Peak Flow --      Pain Score 12/14/20 1826 0     Pain Loc --      Pain Edu? --      Excl. in GC? --    No data found.  Updated Vital Signs BP 119/82 (BP Location: Left Arm)   Pulse 75   Temp 98.2 F (36.8 C) (Oral)   Resp 18   Ht 5\' 7"  (1.702 m)   Wt 187 lb (84.8 kg)   SpO2 97%   BMI 29.29 kg/m   Physical Exam Vitals and nursing note reviewed.  Constitutional:      General: She is not in acute distress.    Appearance: Normal appearance. She is obese. She is not ill-appearing.  HENT:     Head: Normocephalic and atraumatic.     Right Ear: Tympanic membrane, ear canal and external ear normal.     Left Ear:  Tympanic membrane, ear canal and external ear normal.     Nose: Nose normal.     Mouth/Throat:     Mouth: Mucous membranes are moist.     Pharynx: Oropharynx is clear.  Eyes:     Extraocular Movements: Extraocular movements intact.     Conjunctiva/sclera: Conjunctivae normal.     Pupils: Pupils are equal, round, and reactive to light.  Cardiovascular:     Rate and Rhythm: Normal rate and regular rhythm.     Pulses: Normal pulses.     Heart sounds: Normal heart sounds.  Pulmonary:     Effort: Pulmonary effort is normal.     Breath sounds: Normal breath sounds.  Musculoskeletal:        General: Normal range of motion.     Cervical back: Normal range of motion and neck supple.  Skin:    General: Skin is warm and dry.  Neurological:     General: No focal deficit present.     Mental Status: She is alert and oriented to person, place, and time.     UC Treatments / Results  Labs (all labs ordered are listed, but only abnormal results are displayed) Labs Reviewed  COVID-19, FLU A+B NAA    EKG   Radiology No results found.  Procedures Procedures (including critical care time)  Medications Ordered in UC Medications - No data to display  Initial Impression / Assessment and Plan / UC Course  I have reviewed the triage vital signs and the nursing notes.  Pertinent labs & imaging results that were available during my care of the patient were reviewed by me and considered in my medical decision making (see chart for details).     MDM: 1.  Viral illness-Advised patient we will follow-up with COVID-19/flu A/B results once received. Final Clinical Impressions(s) / UC Diagnoses   Final diagnoses:  Viral illness  Fatigue, unspecified type     Discharge Instructions      Advised patient we will follow-up with COVID-19/flu A/B results once received.     ED Prescriptions   None    PDMP not reviewed this encounter.   12/16/20, FNP 12/14/20 Trevor Iha

## 2020-12-14 NOTE — Discharge Instructions (Addendum)
Advised patient we will follow-up with COVID-19/flu A/B results once received.

## 2020-12-14 NOTE — ED Triage Notes (Signed)
Congestion, chills, runny nose, diarrhea, dizziness x 2 days. Fatigue. Negative Covid test. Vaccinated for Covid Not for Flu.

## 2020-12-16 LAB — COVID-19, FLU A+B NAA
Influenza A, NAA: NOT DETECTED
Influenza B, NAA: NOT DETECTED
SARS-CoV-2, NAA: NOT DETECTED

## 2021-01-11 ENCOUNTER — Encounter: Payer: Self-pay | Admitting: Family Medicine

## 2021-01-11 NOTE — Telephone Encounter (Signed)
We can do some testing her.  See if he can make an appt

## 2021-01-14 ENCOUNTER — Other Ambulatory Visit: Payer: Self-pay | Admitting: Sports Medicine

## 2021-01-14 DIAGNOSIS — S8991XA Unspecified injury of right lower leg, initial encounter: Secondary | ICD-10-CM

## 2021-01-16 ENCOUNTER — Other Ambulatory Visit: Payer: Self-pay | Admitting: Family Medicine

## 2021-02-06 ENCOUNTER — Telehealth: Payer: BC Managed Care – PPO | Admitting: Family Medicine

## 2021-02-06 ENCOUNTER — Ambulatory Visit (INDEPENDENT_AMBULATORY_CARE_PROVIDER_SITE_OTHER): Payer: BC Managed Care – PPO | Admitting: Sports Medicine

## 2021-02-06 ENCOUNTER — Ambulatory Visit (INDEPENDENT_AMBULATORY_CARE_PROVIDER_SITE_OTHER): Payer: BC Managed Care – PPO

## 2021-02-06 ENCOUNTER — Other Ambulatory Visit: Payer: Self-pay

## 2021-02-06 VITALS — BP 116/81 | HR 78 | Wt 183.1 lb

## 2021-02-06 DIAGNOSIS — R112 Nausea with vomiting, unspecified: Secondary | ICD-10-CM | POA: Insufficient documentation

## 2021-02-06 DIAGNOSIS — R197 Diarrhea, unspecified: Secondary | ICD-10-CM

## 2021-02-06 DIAGNOSIS — R1032 Left lower quadrant pain: Secondary | ICD-10-CM | POA: Diagnosis not present

## 2021-02-06 LAB — POCT URINALYSIS DIP (CLINITEK)
Bilirubin, UA: NEGATIVE
Glucose, UA: NEGATIVE mg/dL
Ketones, POC UA: NEGATIVE mg/dL
Leukocytes, UA: NEGATIVE
Nitrite, UA: NEGATIVE
POC PROTEIN,UA: NEGATIVE
Spec Grav, UA: 1.01 (ref 1.010–1.025)
Urobilinogen, UA: 0.2 E.U./dL
pH, UA: 6.5 (ref 5.0–8.0)

## 2021-02-06 IMAGING — DX DG ABDOMEN 1V
2 series · 2 of 2 positions shown · non-contrast
Comparison: None.

CLINICAL DATA: Left flank pain

EXAM:
ABDOMEN - 1 VIEW

[abdomen kub (1 of 2)]
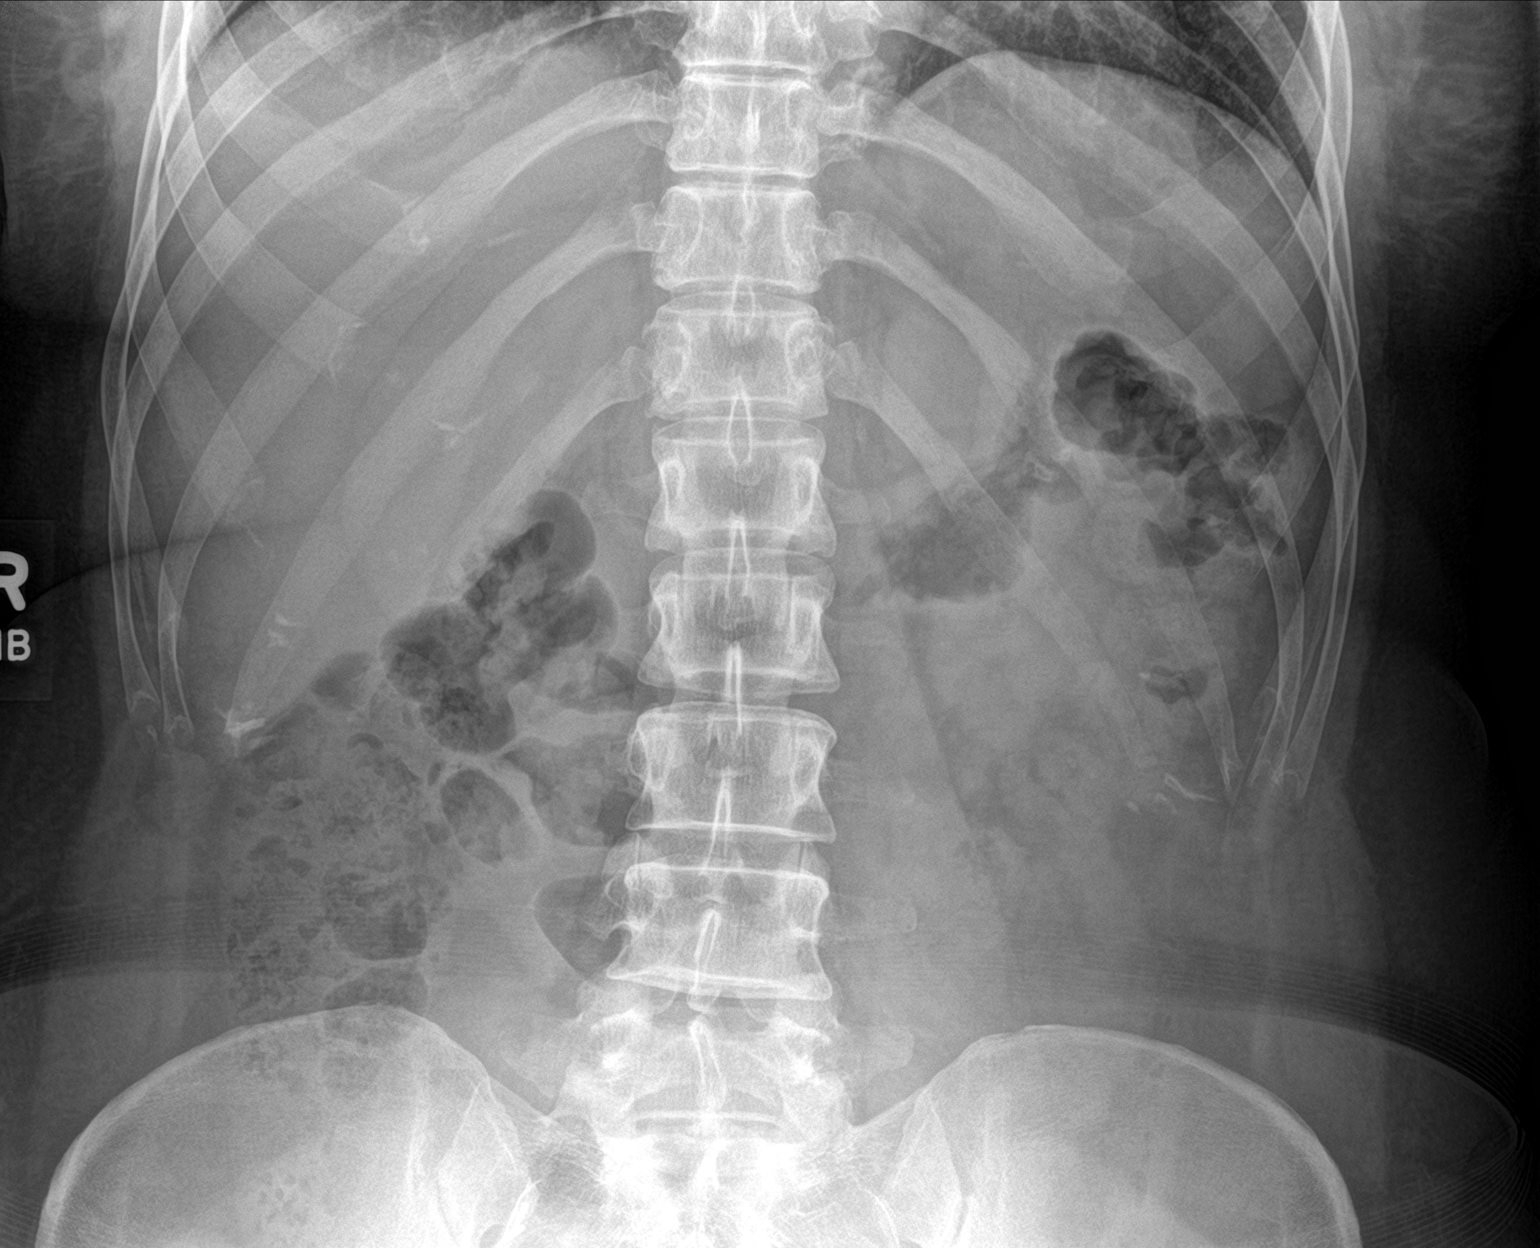

[abdomen kub (2 of 2)]
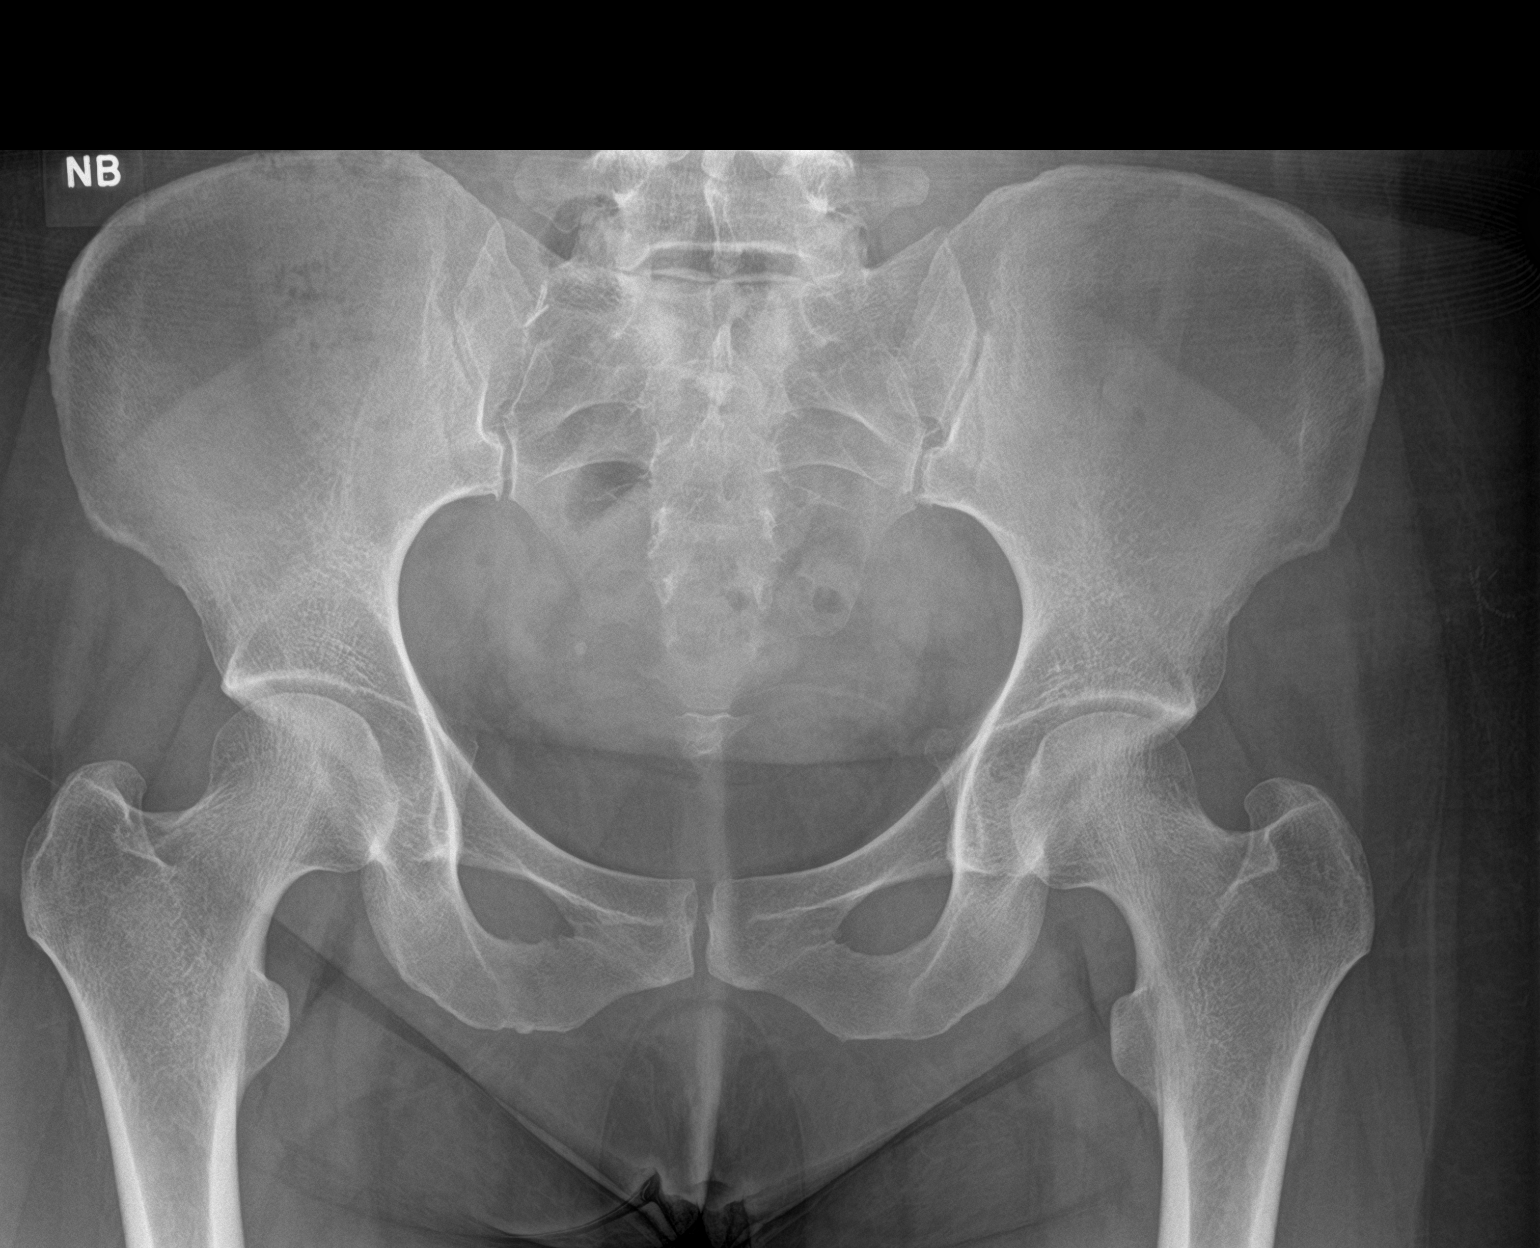

[2 of 2 positions shown; findings below may reference images not displayed]

FINDINGS: Scattered large and small bowel gas is noted. No definitive renal or
ureteral stones are seen. Phlebolith is noted within the pelvis. No
bony abnormality is seen.
IMPRESSION: No acute abnormality noted.

## 2021-02-06 MED ORDER — PROCHLORPERAZINE MALEATE 10 MG PO TABS: 10.0000 mg | ORAL_TABLET | Freq: Four times a day (QID) | ORAL | 3 refills | Status: DC | PRN

## 2021-02-06 MED ORDER — ONDANSETRON 8 MG PO TBDP: 8.0000 mg | ORAL_TABLET | Freq: Three times a day (TID) | ORAL | 3 refills | Status: DC | PRN

## 2021-02-06 MED ORDER — DIPHENOXYLATE-ATROPINE 2.5-0.025 MG PO TABS
ORAL_TABLET | ORAL | 3 refills | Status: DC
Start: 2021-02-06 — End: 2021-03-21

## 2021-02-06 NOTE — Progress Notes (Signed)
° ° °  Procedures performed today:    None.  Independent interpretation of notes and tests performed by another provider:   None.  Brief History, Exam, Impression, and Recommendations:    Nausea vomiting and diarrhea with left flank pain Acute onset, within 24 hours left flank pain, nausea, vomiting, diarrhea. No melena, hematochezia, hematemesis. No fevers, chills, no sick contacts. Did visit a cousin in [place] the day prior. Exam is benign with exception of midepigastric and left upper quadrant tenderness, no guarding or rigidity, no costovertebral angle pain. Urinalysis was positive for blood so we will get a KUB. We will treat this conservatively with Compazine, Lomotil, and Zofran. Hydrate aggressively, return to see [place] if not better in a week.    ___________________________________________ Ihor Austin. Benjamin Stain, M.D., ABFM., CAQSM. Primary Care and Sports Medicine [place] [facility]  Adjunct Instructor of Family Medicine  [employer]

## 2021-02-06 NOTE — Progress Notes (Signed)
Denise Huff  Needs in person eval due to several concerns including back pain associated with n/v/d

## 2021-02-06 NOTE — Addendum Note (Signed)
Addended by: Gaynelle Arabian on: 02/06/2021 04:44 PM   Modules accepted: Orders

## 2021-02-06 NOTE — Assessment & Plan Note (Addendum)
Acute onset, within 24 hours left flank pain, nausea, vomiting, diarrhea. No melena, hematochezia, hematemesis. No fevers, chills, no sick contacts. Did visit a cousin in [place] the day prior. Exam is benign with exception of midepigastric and left upper quadrant tenderness, no guarding or rigidity, no costovertebral angle pain. Urinalysis was positive for blood so we will get a KUB. We will treat this conservatively with Compazine, Lomotil, and Zofran. Hydrate aggressively, return to see [place] if not better in a week.

## 2021-02-07 LAB — UNLABELED: Test Ordered On Req: 395

## 2021-02-11 LAB — PAT ID TIQ DOC

## 2021-02-11 LAB — URINE CULTURE
MICRO NUMBER:: 12785554
SPECIMEN QUALITY:: ADEQUATE

## 2021-02-21 ENCOUNTER — Ambulatory Visit: Payer: BC Managed Care – PPO | Admitting: Sports Medicine

## 2021-02-24 ENCOUNTER — Telehealth: Payer: Self-pay | Admitting: Family Medicine

## 2021-02-24 NOTE — Telephone Encounter (Signed)
Request faxed to [facility].  T. Oluwadamilola Deliz, CMA °

## 2021-02-24 NOTE — Telephone Encounter (Signed)
Please fax request for Pap smear from Dr. Manus Gunning.  I think she may have had it around 2021.

## 2021-03-18 ENCOUNTER — Encounter: Payer: Self-pay | Admitting: Family Medicine

## 2021-03-20 NOTE — Telephone Encounter (Signed)
I am very confused.  Metoprolol does not cause an ACE cough.  An ACE cough is caused by a group of blood pressure medicines called ACE inhibitor's and metoprolol does not fall into that category.  Maybe she needs an appointment?  I am not sure?

## 2021-03-21 ENCOUNTER — Other Ambulatory Visit: Payer: Self-pay

## 2021-03-21 ENCOUNTER — Ambulatory Visit: Payer: BC Managed Care – PPO | Admitting: Family Medicine

## 2021-03-21 ENCOUNTER — Encounter: Payer: Self-pay | Admitting: Family Medicine

## 2021-03-21 VITALS — BP 123/63 | HR 75 | Resp 16 | Ht 67.0 in | Wt 186.0 lb

## 2021-03-21 DIAGNOSIS — T50905A Adverse effect of unspecified drugs, medicaments and biological substances, initial encounter: Secondary | ICD-10-CM | POA: Diagnosis not present

## 2021-03-21 DIAGNOSIS — R053 Chronic cough: Secondary | ICD-10-CM

## 2021-03-21 DIAGNOSIS — R000 Tachycardia, unspecified: Secondary | ICD-10-CM | POA: Diagnosis not present

## 2021-03-21 DIAGNOSIS — R002 Palpitations: Secondary | ICD-10-CM

## 2021-03-21 MED ORDER — ATENOLOL 25 MG PO TABS: 25.0000 mg | ORAL_TABLET | Freq: Every day | ORAL | 1 refills | Status: DC

## 2021-03-21 NOTE — Progress Notes (Signed)
Acute Office Visit  Subjective:    Patient ID: Denise Huff, female    DOB: 1980-03-27, 41 y.o.   MRN: [id number]  Chief Complaint  Patient presents with   elevated heartrate     Patient stated she experienced a cough while taking Metoprolol. Patient stated the cough went away immediately after stopping the Metoprolol. After stopping the Metoprolol, patient complains of elevated heart rate around 120, 130 and 140.     HPI Patient is in today for Rapid heart rate.  Rapid heart rate started after she had a viral illness that we suspect was probably COVID.  Patient stated she experienced a cough while taking Metoprolol. Patient stated the cough went away immediately after stopping the Metoprolol. After stopping the Metoprolol, patient complains of elevated heart rate around 120, 130 and 140.  She says she feels the heart racing even while just standing in the shower washing her hair and not doing anything strenuously physically active.  She will get a little bit of midsternal discomfort in her chest as well as a little bit of shortness of breath.  She says usually she can sit down and drink some water and it slowly will come back down.  She is able to exercise.  She is actually been working out twice a week and has not had worrisome tachycardic symptoms during exercise.  Positive family history of coronary artery disease.  Dad with triple bypass at age 31.  Long heart monitor performed March 2022 showing normal sinus rhythm with rare PACs and PVCs that did not correlate to her symptoms.  No past medical history on file.  Past Surgical History:  Procedure Laterality Date   APPENDECTOMY     CESAREAN SECTION     KNEE SURGERY     TONSILLECTOMY     TYMPANOSTOMY TUBE PLACEMENT      Family History  Problem Relation Age of Onset   Skin cancer Mother    Depression Mother    CAD Father 68       triple bypass    Hypertension Father    Skin cancer Father    Diabetes Father     Polycystic ovary syndrome Sister    Depression Sister    Breast cancer Maternal Aunt     Social History   Socioeconomic History   Marital status: Married    Spouse name: Waunita Schooner   Number of children: 2   Years of education: College    Highest education level: Not on file  Occupational History   Occupation: Pharmacist, hospital    Comment: [employer] s[employer]  Tobacco Use   Smoking status: Never   Smokeless tobacco: Never  Vaping Use   Vaping Use: Never used  Substance and Sexual Activity   Alcohol use: Yes    Comment: rarely   Drug use: Never   Sexual activity: Yes    Partners: Male    Birth control/protection: Condom  Other Topics Concern   Not on file  Social History Narrative   Not on file   Social Determinants of Health   Financial Resource Strain: Not on file  Food Insecurity: Not on file  Transportation Needs: Not on file  Physical Activity: Not on file  Stress: Not on file  Social Connections: Not on file  Intimate Partner Violence: Not on file    Outpatient Medications Prior to Visit  Medication Sig Dispense Refill   ALPRAZolam (XANAX) 0.25 MG tablet Take 1 tablet (0.25 mg total) by mouth at bedtime as  needed for anxiety or sleep. 30 tablet 1   atorvastatin (LIPITOR) 20 MG tablet Take 1 tablet (20 mg total) by mouth daily. 90 tablet 3   cyanocobalamin (,VITAMIN B-12,) 1000 MCG/ML injection INJECT 1 ML INTO THE MUSCLE ONCE A WEEK. 12 mL 1   JUNEL FE 1/20 1-20 MG-MCG tablet Take 1 tablet by mouth daily.  4   sertraline (ZOLOFT) 100 MG tablet TAKE 1.5 TABLETS BY MOUTH DAILY 135 tablet 1   diphenoxylate-atropine (LOMOTIL) 2.5-0.025 MG tablet One to 2 tablets by mouth 4 times a day as needed for diarrhea. 60 tablet 3   metoprolol succinate (TOPROL-XL) 25 MG 24 hr tablet TAKE 1 TABLET (25 MG TOTAL) BY MOUTH DAILY. 90 tablet 1   ondansetron (ZOFRAN-ODT) 8 MG disintegrating tablet Take 1 tablet (8 mg total) by mouth every 8 (eight) hours as needed for nausea. 20 tablet 3    prochlorperazine (COMPAZINE) 10 MG tablet Take 1 tablet (10 mg total) by mouth every 6 (six) hours as needed for nausea or vomiting. 30 tablet 3   No facility-administered medications prior to visit.    Allergies  Allergen Reactions   Metoprolol Cough   Other Other (See Comments)    Latex band-aids with adhesive causes welts. Okay with fabric/paper band-aids.     Review of Systems     Objective:    Physical Exam Vitals and nursing note reviewed.  Constitutional:      Appearance: She is well-developed.  HENT:     Head: Normocephalic and atraumatic.  Cardiovascular:     Rate and Rhythm: Normal rate and regular rhythm.     Heart sounds: Normal heart sounds.  Pulmonary:     Effort: Pulmonary effort is normal.     Breath sounds: Normal breath sounds.  Skin:    General: Skin is warm and dry.  Neurological:     Mental Status: She is alert and oriented to person, place, and time.  Psychiatric:        Behavior: Behavior normal.    BP 123/63    Pulse 75    Resp 16    Ht _0  (1.702 m)    Wt 186 lb (84.4 kg)    LMP 03/07/2021    BMI 29.13 kg/m  Wt Readings from Last 3 Encounters:  03/21/21 186 lb (84.4 kg)  02/06/21 183 lb 1.9 oz (83.1 kg)  12/14/20 187 lb (84.8 kg)    Health Maintenance Due  Topic Date Due   HIV Screening  Never done    There are no preventive care reminders to display for this patient.   Lab Results  Component Value Date   TSH 1.08 04/11/2020   Lab Results  Component Value Date   WBC 5.9 04/11/2020   HGB 13.5 04/11/2020   HCT 39.4 04/11/2020   MCV 92.3 04/11/2020   PLT 279 04/11/2020   Lab Results  Component Value Date   NA 140 11/21/2020   K 4.4 11/21/2020   CO2 29 11/21/2020   GLUCOSE 89 11/21/2020   BUN 15 11/21/2020   CREATININE 0.77 11/21/2020   BILITOT 0.4 11/21/2020   AST 14 11/21/2020   ALT 19 11/21/2020   PROT 6.5 11/21/2020   CALCIUM 9.4 11/21/2020   EGFR 101 11/21/2020   Lab Results  Component Value Date   CHOL 180  11/21/2020   Lab Results  Component Value Date   HDL 54 11/21/2020   Lab Results  Component Value Date   LDLCALC 97 11/21/2020  Lab Results  Component Value Date   TRIG 192 (H) 11/21/2020   Lab Results  Component Value Date   CHOLHDL 3.3 11/21/2020   Lab Results  Component Value Date   HGBA1C 5.2 04/03/2018       Assessment & Plan:   Problem List Items Addressed This Visit       Other   Palpitations - Primary   Relevant Medications   atenolol (TENORMIN) 25 MG tablet   Other Relevant Orders   ECHOCARDIOGRAM COMPLETE   TSH   CBC   COMPLETE METABOLIC PANEL WITH GFR   Other Visit Diagnoses     Tachycardia       Medication side effect, initial encounter       Chronic cough          Palpitations and tachycardia with concomitant chest pain and shortness of breath-I do think at this point it warrants further work-up.  I was hopeful that if this really was secondary to COVID it would eventually resolve itself as we have seen with many of our long-haul COVID patients.  We discussed getting an echocardiogram and maybe even considering a stress test since she does have an early family history of heart disease.  If her dad was 1 at bypass I suspect that he had at least significant coronary disease for at least a decade which would put him in his late 68s.  She does currently take a statin to reduce her risk and has been exercising regularly.  Could consider repeat cardiac monitor if needed.  We also discussed getting back on either a beta-blocker and just trying a different 1 to see if she tolerates it or a calcium channel blocker.  She is open to trying a different beta-blocker monitor for shortness of breath.  Stop if any concerns.  We will also get updated labs just to rule out anemia, thyroid disorder, electrolyte disturbance etc.  Cough-in 1% of people beta-blockers can cause some bronchospasm.  She has no underlying history of asthma but says it was pretty consistent  with when she started the medication and stopped abruptly when she stopped the beta-blocker.  So we will add this to her intolerance list.  Meds ordered this encounter  Medications   atenolol (TENORMIN) 25 MG tablet    Sig: Take 1 tablet (25 mg total) by mouth daily.    Dispense:  30 tablet    Refill:  1     Beatrice Lecher, MD

## 2021-03-22 ENCOUNTER — Encounter: Payer: Self-pay | Admitting: Family Medicine

## 2021-03-22 LAB — COMPLETE METABOLIC PANEL WITH GFR
AG Ratio: 1.7 (calc) (ref 1.0–2.5)
ALT: 13 U/L (ref 6–29)
AST: 14 U/L (ref 10–30)
Albumin: 4.3 g/dL (ref 3.6–5.1)
Alkaline phosphatase (APISO): 42 U/L (ref 31–125)
BUN: 11 mg/dL (ref 7–25)
CO2: 26 mmol/L (ref 20–32)
Calcium: 9.2 mg/dL (ref 8.6–10.2)
Chloride: 104 mmol/L (ref 98–110)
Creat: 0.7 mg/dL (ref 0.50–0.97)
Globulin: 2.6 g/dL (calc) (ref 1.9–3.7)
Glucose, Bld: 83 mg/dL (ref 65–99)
Potassium: 4.2 mmol/L (ref 3.5–5.3)
Sodium: 139 mmol/L (ref 135–146)
Total Bilirubin: 0.6 mg/dL (ref 0.2–1.2)
Total Protein: 6.9 g/dL (ref 6.1–8.1)
eGFR: 113 mL/min/{1.73_m2} (ref >=60)

## 2021-03-22 LAB — CBC
HCT: 38.2 % (ref 35.0–45.0)
Hemoglobin: 12.9 g/dL (ref 11.7–15.5)
MCH: 31.8 pg (ref 27.0–33.0)
MCHC: 33.8 g/dL (ref 32.0–36.0)
MCV: 94.1 fL (ref 80.0–100.0)
MPV: 9.8 fL (ref 7.5–12.5)
Platelets: 303 10*3/uL (ref 140–400)
RBC: 4.06 10*6/uL (ref 3.80–5.10)
RDW: 11.7 % (ref 11.0–15.0)
WBC: 7.2 10*3/uL (ref 3.8–10.8)

## 2021-03-22 LAB — TSH: TSH: 0.73 mIU/L

## 2021-03-22 NOTE — Progress Notes (Signed)
Hi Denise Huff, your labs overall look good very reassuring.  I meant to ask you if we decide to do a stress test if you feel like you could walk on the treadmill.  I know you have been going to the gym and working out but I just wanted to make sure.

## 2021-03-26 ENCOUNTER — Other Ambulatory Visit: Payer: Self-pay | Admitting: Family Medicine

## 2021-03-26 DIAGNOSIS — D510 Vitamin B12 deficiency anemia due to intrinsic factor deficiency: Secondary | ICD-10-CM

## 2021-04-13 ENCOUNTER — Other Ambulatory Visit: Payer: Self-pay | Admitting: Family Medicine

## 2021-04-13 DIAGNOSIS — R002 Palpitations: Secondary | ICD-10-CM

## 2021-04-20 ENCOUNTER — Ambulatory Visit: Payer: BC Managed Care – PPO | Admitting: Family Medicine

## 2021-04-20 ENCOUNTER — Other Ambulatory Visit: Payer: Self-pay

## 2021-04-20 VITALS — BP 103/55 | HR 67 | Ht 67.0 in | Wt 185.0 lb

## 2021-04-20 DIAGNOSIS — R002 Palpitations: Secondary | ICD-10-CM

## 2021-04-20 DIAGNOSIS — I959 Hypotension, unspecified: Secondary | ICD-10-CM

## 2021-04-20 NOTE — Assessment & Plan Note (Signed)
She is actually doing really well with the atenolol.  It seems to be controlling her symptoms fairly well she still having a little breakthrough but they are brief and not as high of a heart rate which is great so for now we will continue with current regimen. ?

## 2021-04-20 NOTE — Patient Instructions (Signed)
We can call you once we get the echocardiogram results back. ?

## 2021-04-20 NOTE — Progress Notes (Signed)
? ?Established Patient Office Visit ? ?Subjective:  ?Patient ID: Denise Huff, female    DOB: August 31, 1980  Age: 41 y.o. MRN: [id number] ? ?CC:  ?Chief Complaint  ?Patient presents with  ? Follow-up  ?   ?  ? ? ?HPI ?Denise Huff presents for  ? ?Follow-up palpitations-started on atenolol 4 weeks ago.  We had placed a referral for echocardiogram 4 weeks ago.  Doing really well with the atenolol she usually takes it in the evening and she is noticed a significant improvement in the frequency of symptoms.  Also when she does experience it is usually much more brief and her heart rate does not get as high. ? ?On the way here today right before she came she suddenly felt hot most like a hot flash and then felt a little dizzy and then afterwards felt really sweaty and clammy which was a little bit unusual.  And now she feels a little shaky.  It has been a little bit of a stressful day as her dad sexually and then go back into the hospital tomorrow for heart catheterization.  But she ate normally she drink normally. ? ?Unfortunately we were unable to get the echocardiogram scheduled for her but we have faxed everything over to [facility] so if she does not hear from them by Friday afternoon please let us know by Monday. ? ?No past medical history on file. ? ?Past Surgical History:  ?Procedure Laterality Date  ? APPENDECTOMY    ? CESAREAN SECTION    ? KNEE SURGERY    ? TONSILLECTOMY    ? TYMPANOSTOMY TUBE PLACEMENT    ? ? ?Family History  ?Problem Relation Age of Onset  ? Skin cancer Mother   ? Depression Mother   ? CAD Father 38  ?     triple bypass   ? Hypertension Father   ? Skin cancer Father   ? Diabetes Father   ? Polycystic ovary syndrome Sister   ? Depression Sister   ? Breast cancer Maternal Aunt   ? ? ?Social History  ? ?Socioeconomic History  ? Marital status: Married  ?  Spouse name: Waunita Schooner  ? Number of children: 2  ? Years of education: College   ? Highest education level: Not on file  ?Occupational History   ? Occupation: Pharmacist, hospital  ?  Comment: [employer]  ?Tobacco Use  ? Smoking status: Never  ? Smokeless tobacco: Never  ?Vaping Use  ? Vaping Use: Never used  ?Substance and Sexual Activity  ? Alcohol use: Yes  ?  Comment: rarely  ? Drug use: Never  ? Sexual activity: Yes  ?  Partners: Male  ?  Birth control/protection: Condom  ?Other Topics Concern  ? Not on file  ?Social History Narrative  ? Not on file  ? ?Social Determinants of Health  ? ?Financial Resource Strain: Not on file  ?Food Insecurity: Not on file  ?Transportation Needs: Not on file  ?Physical Activity: Not on file  ?Stress: Not on file  ?Social Connections: Not on file  ?Intimate Partner Violence: Not on file  ? ? ?Outpatient Medications Prior to Visit  ?Medication Sig Dispense Refill  ? ALPRAZolam (XANAX) 0.25 MG tablet Take 1 tablet (0.25 mg total) by mouth at bedtime as needed for anxiety or sleep. 30 tablet 1  ? atenolol (TENORMIN) 25 MG tablet Take 1 tablet (25 mg total) by mouth daily. 30 tablet 1  ? atorvastatin (LIPITOR) 20 MG tablet Take 1 tablet (  20 mg total) by mouth daily. 90 tablet 3  ? cyanocobalamin (,VITAMIN B-12,) 1000 MCG/ML injection INJECT 1 ML INTO THE MUSCLE ONCE A WEEK. 12 mL 1  ? JUNEL FE 1/20 1-20 MG-MCG tablet Take 1 tablet by mouth daily.  4  ? sertraline (ZOLOFT) 100 MG tablet TAKE 1.5 TABLETS BY MOUTH DAILY 135 tablet 1  ? ?No facility-administered medications prior to visit.  ? ? ?Allergies  ?Allergen Reactions  ? Metoprolol Cough  ? Other Other (See Comments)  ?  Latex band-aids with adhesive causes welts. Okay with fabric/paper band-aids.   ? ? ?ROS ?Review of Systems ? ?  ?Objective:  ?  ?Physical Exam ?Constitutional:   ?   Appearance: Normal appearance. She is well-developed.  ?HENT:  ?   Head: Normocephalic and atraumatic.  ?Cardiovascular:  ?   Rate and Rhythm: Normal rate and regular rhythm.  ?   Heart sounds: Normal heart sounds.  ?Pulmonary:  ?   Effort: Pulmonary effort is normal.  ?   Breath sounds: Normal  breath sounds.  ?Skin: ?   General: Skin is warm and dry.  ?Neurological:  ?   Mental Status: She is alert and oriented to person, place, and time.  ?Psychiatric:     ?   Behavior: Behavior normal.  ? ? ?BP (!) 103/55   Pulse 67   Ht 5' 7"  (1.702 m)   Wt 185 lb (83.9 kg)   SpO2 98%   BMI 28.98 kg/m?  ?Wt Readings from Last 3 Encounters:  ?04/20/21 185 lb (83.9 kg)  ?03/21/21 186 lb (84.4 kg)  ?02/06/21 183 lb 1.9 oz (83.1 kg)  ? ? ? ?Health Maintenance Due  ?Topic Date Due  ? HIV Screening  Never done  ? ? ?There are no preventive care reminders to display for this patient. ? ?Lab Results  ?Component Value Date  ? TSH 0.73 03/21/2021  ? ?Lab Results  ?Component Value Date  ? WBC 7.2 03/21/2021  ? HGB 12.9 03/21/2021  ? HCT 38.2 03/21/2021  ? MCV 94.1 03/21/2021  ? PLT 303 03/21/2021  ? ?Lab Results  ?Component Value Date  ? NA 139 03/21/2021  ? K 4.2 03/21/2021  ? CO2 26 03/21/2021  ? GLUCOSE 83 03/21/2021  ? BUN 11 03/21/2021  ? CREATININE 0.70 03/21/2021  ? BILITOT 0.6 03/21/2021  ? AST 14 03/21/2021  ? ALT 13 03/21/2021  ? PROT 6.9 03/21/2021  ? CALCIUM 9.2 03/21/2021  ? EGFR 113 03/21/2021  ? ?Lab Results  ?Component Value Date  ? CHOL 180 11/21/2020  ? ?Lab Results  ?Component Value Date  ? HDL 54 11/21/2020  ? ?Lab Results  ?Component Value Date  ? [place] 97 11/21/2020  ? ?Lab Results  ?Component Value Date  ? TRIG 192 (H) 11/21/2020  ? ?Lab Results  ?Component Value Date  ? CHOLHDL 3.3 11/21/2020  ? ?Lab Results  ?Component Value Date  ? HGBA1C 5.2 04/03/2018  ? ? ?  ?Assessment & Plan:  ? ?Problem List Items Addressed This Visit   ? ?  ? Other  ? Palpitations - Primary  ?  She is actually doing really well with the atenolol.  It seems to be controlling her symptoms fairly well she still having a little breakthrough but they are brief and not as high of a heart rate which is great so for now we will continue with current regimen. ?  ?  ? ?Other Visit Diagnoses   ? ? Hypotension,  unspecified hypotension  type      ? ?  ? ? ?Hypotension-she had episode of feeling a little lightheaded dizzy and hot and sweaty.  And her blood pressure is running a little bit low today.  But was also stressful day she has eaten something and had something to drink's so I do not feel like that this is related.  She still feels a little shaky but feels a little better than she did so encouraged her to keep an eye on it.  If it happens again please let me know. ? ?We will continue to work on trying to get the echocardiogram scheduled. ? ?No orders of the defined types were placed in this encounter. ? ? ?Follow-up: Return in about 3 months (around 07/21/2021) for palpitations.  ? ? ?Beatrice Lecher, MD ?

## 2021-05-15 ENCOUNTER — Other Ambulatory Visit: Payer: Self-pay | Admitting: Family Medicine

## 2021-05-15 DIAGNOSIS — R002 Palpitations: Secondary | ICD-10-CM

## 2021-05-29 ENCOUNTER — Encounter: Payer: Self-pay | Admitting: Family Medicine

## 2021-05-29 DIAGNOSIS — R002 Palpitations: Secondary | ICD-10-CM

## 2021-05-29 DIAGNOSIS — R5383 Other fatigue: Secondary | ICD-10-CM

## 2021-06-01 NOTE — Progress Notes (Signed)
Hi Denise Huff, your thyroid panel looks great and is within normal range.

## 2021-06-02 LAB — T4, FREE: Free T4: 1 ng/dL (ref 0.8–1.8)

## 2021-06-02 LAB — TSH: TSH: 0.63 mIU/L

## 2021-06-02 LAB — T3, FREE: T3, Free: 3.2 pg/mL (ref 2.3–4.2)

## 2021-06-02 LAB — THYROID PEROXIDASE ANTIBODIES (TPO) (REFL): Thyroperoxidase Ab SerPl-aCnc: 1 IU/mL (ref <9)

## 2021-06-14 ENCOUNTER — Encounter: Payer: Self-pay | Admitting: Family Medicine

## 2021-07-11 ENCOUNTER — Encounter: Payer: Self-pay | Admitting: Family Medicine

## 2021-07-11 DIAGNOSIS — E782 Mixed hyperlipidemia: Secondary | ICD-10-CM

## 2021-07-11 NOTE — Telephone Encounter (Signed)
needs lipids.

## 2021-07-18 ENCOUNTER — Encounter: Payer: Self-pay | Admitting: Family Medicine

## 2021-07-18 ENCOUNTER — Ambulatory Visit (INDEPENDENT_AMBULATORY_CARE_PROVIDER_SITE_OTHER): Payer: BC Managed Care – PPO | Admitting: Family Medicine

## 2021-07-18 VITALS — BP 117/66 | HR 71 | Resp 16 | Ht 67.0 in | Wt 186.0 lb

## 2021-07-18 DIAGNOSIS — Z0000 Encounter for general adult medical examination without abnormal findings: Secondary | ICD-10-CM

## 2021-07-18 NOTE — Progress Notes (Signed)
Complete physical exam  Patient: Denise Huff   DOB: [birth year]   41 y.o. Female  MRN: [id number]  Subjective:    Chief Complaint  Patient presents with   Annual Exam    Not fasting     Denise Huff is a 40 y.o. female who presents today for a complete physical exam. She reports consuming a general diet. Gym/ health club routine includes elliptical, stationary bike 2 days/week. She generally feels well. She reports sleeping fairly well. She does not have additional problems to discuss today.    Most recent fall risk assessment:    07/18/2021    4:42 PM  Fall Risk   Falls in the past year? 0  Number falls in past yr: 0  Injury with Fall? 0  Risk for fall due to : No Fall Risks  Follow up Falls prevention discussed     Most recent depression screenings:    07/18/2021    4:42 PM 03/21/2021    3:56 PM  PHQ 2/9 Scores  PHQ - 2 Score 0 0  PHQ- 9 Score 3         Patient Care Team: Agapito Games, MD as PCP - General (Family Medicine)   Outpatient Medications Prior to Visit  Medication Sig   ALPRAZolam (XANAX) 0.25 MG tablet Take 1 tablet (0.25 mg total) by mouth at bedtime as needed for anxiety or sleep.   atenolol (TENORMIN) 25 MG tablet TAKE 1 TABLET (25 MG TOTAL) BY MOUTH DAILY.   atorvastatin (LIPITOR) 20 MG tablet Take 1 tablet (20 mg total) by mouth daily.   cyanocobalamin (,VITAMIN B-12,) 1000 MCG/ML injection INJECT 1 ML INTO THE MUSCLE ONCE A WEEK.   JUNEL FE 1/20 1-20 MG-MCG tablet Take 1 tablet by mouth daily.   sertraline (ZOLOFT) 100 MG tablet TAKE 1.5 TABLETS BY MOUTH DAILY   No facility-administered medications prior to visit.    ROS        Objective:     BP 117/66   Pulse 71   Resp 16   Ht 5\' 7"  (1.702 m)   Wt 186 lb (84.4 kg)   LMP 06/26/2021   SpO2 98%   BMI 29.13 kg/m    Physical Exam Vitals reviewed.  Constitutional:      Appearance: She is well-developed.  HENT:     Head: Normocephalic and atraumatic.      Right Ear: Ear canal and external ear normal.     Left Ear: Tympanic membrane, ear canal and external ear normal.     Ears:     Comments: Right tM looks perforated.     Nose: Nose normal.  Eyes:     Conjunctiva/sclera: Conjunctivae normal.     Pupils: Pupils are equal, round, and reactive to light.  Neck:     Thyroid: No thyromegaly.  Cardiovascular:     Rate and Rhythm: Normal rate and regular rhythm.     Heart sounds: Normal heart sounds.  Pulmonary:     Effort: Pulmonary effort is normal.     Breath sounds: Normal breath sounds. No wheezing.  Abdominal:     General: Bowel sounds are normal.     Palpations: Abdomen is soft.     Tenderness: There is no abdominal tenderness.  Musculoskeletal:     Cervical back: Neck supple.  Lymphadenopathy:     Cervical: No cervical adenopathy.  Skin:    General: Skin is warm and dry.     Coloration: Skin is  not pale.  Neurological:     Mental Status: She is alert and oriented to person, place, and time.  Psychiatric:        Behavior: Behavior normal.     No results found for any visits on 07/18/21.     Assessment & Plan:    Routine Health Maintenance and Physical Exam  Immunization History  Administered Date(s) Administered   Influenza, High Dose Seasonal PF 01/10/2016   Influenza,inj,Quad PF,6+ Mos 10/28/2017, 12/25/2018, 12/18/2020   Influenza,trivalent, recombinat, inj, PF 12/04/2007   PFIZER(Purple Top)SARS-COV-2 Vaccination 04/18/2019, 05/20/2019, 12/21/2019   Pfizer Covid-19 Vaccine Bivalent Booster 68yrs & up 12/18/2020   Tdap 09/23/2007, 10/18/2013   Tetanus 08/27/2000    Health Maintenance  Topic Date Due   HIV Screening  Never done   INFLUENZA VACCINE  09/19/2021   TETANUS/TDAP  10/19/2023   PAP SMEAR-Modifier  11/24/2024   COVID-19 Vaccine  Completed   Hepatitis C Screening  Completed   Pneumococcal Vaccine 71-50 Years old  Aged Out   HPV VACCINES  Aged Out    Discussed health benefits of physical activity,  and encouraged her to engage in regular exercise appropriate for her age and condition.  Problem List Items Addressed This Visit   None Visit Diagnoses     Wellness examination    -  Primary      Keep up a regular exercise program and make sure you are eating a healthy diet Try to eat 4 servings of dairy a day, or if you are lactose intolerant take a calcium with vitamin D daily.  Your vaccines are up to date.  Always with [facility].  Did encourage her to schedule her mammogram after she turns 41 later this year. Pap smear is up-to-date.   Return in about 6 months (around 01/18/2022) for Mood medication .     Nani Gasser, MD

## 2021-07-18 NOTE — Patient Instructions (Signed)
Try to schedule your mammogram after you turn 41.

## 2021-07-19 ENCOUNTER — Telehealth: Payer: Self-pay

## 2021-07-19 DIAGNOSIS — D510 Vitamin B12 deficiency anemia due to intrinsic factor deficiency: Secondary | ICD-10-CM

## 2021-07-19 NOTE — Telephone Encounter (Signed)
Refill for cyanocobalamin refused.  Pt has requested refill too early.  Charyl Bigger, CMA

## 2021-08-01 LAB — LIPID PANEL W/REFLEX DIRECT LDL
Cholesterol: 175 mg/dL (ref <200)
HDL: 51 mg/dL (ref >=50)
LDL Cholesterol (Calc): 96 mg/dL (calc)
Non-HDL Cholesterol (Calc): 124 mg/dL (calc) (ref <130)
Total CHOL/HDL Ratio: 3.4 (calc) (ref <5.0)
Triglycerides: 179 mg/dL — ABNORMAL HIGH (ref <150)

## 2021-08-01 NOTE — Progress Notes (Signed)
Hi Denise Huff, total cholesterol and LDL are at goal.  Triglycerides up just a little bit but better than the last couple years.  So great work on reducing these numbers!

## 2021-08-14 ENCOUNTER — Ambulatory Visit (INDEPENDENT_AMBULATORY_CARE_PROVIDER_SITE_OTHER): Payer: BC Managed Care – PPO

## 2021-08-14 ENCOUNTER — Ambulatory Visit (INDEPENDENT_AMBULATORY_CARE_PROVIDER_SITE_OTHER): Payer: BC Managed Care – PPO | Admitting: Sports Medicine

## 2021-08-14 DIAGNOSIS — M7631 Iliotibial band syndrome, right leg: Secondary | ICD-10-CM

## 2021-08-14 NOTE — Progress Notes (Signed)
    Procedures performed today:    Procedure: Real-time Ultrasound Guided injection of the right iliotibial band Device: Samsung HS60  Verbal informed consent obtained.  Time-out conducted.  Noted no overlying erythema, induration, or other signs of local infection.  Skin prepped in a sterile fashion.  Local anesthesia: Topical Ethyl chloride.  With sterile technique and under real time ultrasound guidance: Noted IT band, advanced needle between the IT band and the femoral condyle laterally, 1 cc Kenalog  40, 2 cc lidocaine , 2 cc bupivacaine injected easily Completed without difficulty  Advised to call if fevers/chills, erythema, induration, drainage, or persistent bleeding.  Images permanently stored and available for review in PACS.  Impression: Technically successful ultrasound guided injection.  Independent interpretation of notes and tests performed by another provider:   None.  Brief History, Exam, Impression, and Recommendations:    It band syndrome, right This is a very pleasant 41 year old female, she did have a meniscal tear with some areas of focal cartilage loss, treated by Dr. Edsel arthroscopically, also had a Durolane injection. Symptoms today are predominantly lateral knee proximal to the lateral femoral condyle. On exam she does have significant tenderness directly over her iliotibial band distally near the femoral condyle. I am really not able to reproduce any pain at the joint lines, or the femoral condyle ends. Mildly positive Ober test. Motion is good, strength is good, negative McMurray's sign, joint is stable. As she has tried some stretching, and pain has been present for a few months we will do an IT band injection today with ultrasound guidance. Additional IT band stretching given today. Return to see me in 6 weeks.  Chronic process with exacerbation and pharmacologic intervention  ___________________________________________ Debby PARAS. Curtis,  M.D., ABFM., CAQSM. Primary Care and Sports Medicine [facility]  Adjunct Instructor of Family Medicine  [employer]

## 2021-08-14 NOTE — Assessment & Plan Note (Signed)
This is a very pleasant 41 year old female, she did have a meniscal tear with some areas of focal cartilage loss, treated by Dr. Emelda Fear arthroscopically, also had a Durolane injection. Symptoms today are predominantly lateral knee proximal to the lateral femoral condyle. On exam she does have significant tenderness directly over her iliotibial band distally near the femoral condyle. I am really not able to reproduce any pain at the joint lines, or the femoral condyle ends. Mildly positive Ober test. Motion is good, strength is good, negative McMurray's sign, joint is stable. As she has tried some stretching, and pain has been present for a few months we will do an IT band injection today with ultrasound guidance. Additional IT band stretching given today. Return to see me in 6 weeks.

## 2021-08-28 ENCOUNTER — Other Ambulatory Visit: Payer: Self-pay | Admitting: Family Medicine

## 2021-08-28 DIAGNOSIS — D510 Vitamin B12 deficiency anemia due to intrinsic factor deficiency: Secondary | ICD-10-CM

## 2021-09-11 ENCOUNTER — Other Ambulatory Visit: Payer: Self-pay | Admitting: Family Medicine

## 2021-09-11 DIAGNOSIS — R002 Palpitations: Secondary | ICD-10-CM

## 2021-09-25 ENCOUNTER — Ambulatory Visit: Payer: BC Managed Care – PPO | Admitting: Sports Medicine

## 2021-09-25 DIAGNOSIS — G8929 Other chronic pain: Secondary | ICD-10-CM | POA: Diagnosis not present

## 2021-09-25 DIAGNOSIS — M25561 Pain in right knee: Secondary | ICD-10-CM

## 2021-09-25 MED ORDER — DEVILS CLAW POWD: 0 refills | Status: DC

## 2021-09-25 MED ORDER — DICLOFENAC SODIUM 75 MG PO TBEC: 75.0000 mg | DELAYED_RELEASE_TABLET | Freq: Two times a day (BID) | ORAL | 3 refills | Status: DC

## 2021-09-25 NOTE — Progress Notes (Signed)
    Procedures performed today:    None.  Independent interpretation of notes and tests performed by another provider:   None.  Brief History, Exam, Impression, and Recommendations:    Chronic pain of right knee This is a pleasant 41 year old female, she has chronic multifactorial right knee pain, this historically started when in taekwondo, she did a spin kick, landed awkwardly and had pain medial joint line, ultimately an MRI showed a lateral meniscal tear and medial chondral thinning. A steroid injection was not effective so she saw Dr. Emelda Fear with [facility], ultimately had an arthroscopy, the meniscal tear was addressed, she then had another steroid injection and ultimately 1 injection of viscosupplementation, nothing helped. I last saw her a few months ago, we did an IT band injection that resolved her lateral IT band related pain. Unfortunately continues to have lateral joint line and medial patellofemoral type pain. Positive painful patellar compression, I explained to her that she does have some unhealthy cartilage and that this would likely result in chronic long-lasting knee pain. We will switch to a different NSAID, adding Devil's Claw (she understands lack of evidence here, she is also tried turmeric and glucosamine/conjoint and already without improvement). We will do this for 4 to 6 weeks and if insufficient improvement we will consider steroid injection. We would probably need a new MRI at that juncture. If this fails we will consider viscosupplementation again. If this fails we will consider PRP, if this fails we will proceed with referral for genicular radiofrequency ablation.  Chronic process with exacerbation of pharmacologic treatment  ____________________________________________ Ihor Austin. Benjamin Stain, M.D., ABFM., CAQSM., AME. Primary Care and Sports Medicine [facility]  Adjunct Professor of Family Medicine  [employer]  Restaurant manager, fast food

## 2021-09-25 NOTE — Assessment & Plan Note (Addendum)
This is a pleasant 41 year old female, she has chronic multifactorial right knee pain, this historically started when in taekwondo, she did a spin kick, landed awkwardly and had pain medial joint line, ultimately an MRI showed a lateral meniscal tear and medial chondral thinning. A steroid injection was not effective so she saw Dr. Emelda Fear with [facility], ultimately had an arthroscopy, the meniscal tear was addressed, she then had another steroid injection and ultimately 1 injection of viscosupplementation, nothing helped. I last saw her a few months ago, we did an IT band injection that resolved her lateral IT band related pain. Unfortunately continues to have lateral joint line and medial patellofemoral type pain. Positive painful patellar compression, I explained to her that she does have some unhealthy cartilage and that this would likely result in chronic long-lasting knee pain. We will switch to a different NSAID, adding Devil's Claw (she understands lack of evidence here, she is also tried turmeric and glucosamine/conjoint and already without improvement). We will do this for 4 to 6 weeks and if insufficient improvement we will consider steroid injection. We would probably need a new MRI at that juncture. If this fails we will consider viscosupplementation again. If this fails we will consider PRP, if this fails we will proceed with referral for genicular radiofrequency ablation.

## 2021-11-15 ENCOUNTER — Ambulatory Visit: Payer: BC Managed Care – PPO | Admitting: Family Medicine

## 2021-11-15 ENCOUNTER — Encounter: Payer: Self-pay | Admitting: Family Medicine

## 2021-11-15 VITALS — BP 104/68 | HR 80 | Temp 98.4°F | Ht 67.0 in | Wt 180.0 lb

## 2021-11-15 DIAGNOSIS — J019 Acute sinusitis, unspecified: Secondary | ICD-10-CM

## 2021-11-15 MED ORDER — AZITHROMYCIN 250 MG PO TABS: ORAL_TABLET | ORAL | 0 refills | Status: AC

## 2021-11-15 NOTE — Progress Notes (Signed)
   Acute Office Visit  Subjective:     Patient ID: Denise Huff, female    DOB: 20-Apr-1980, 41 y.o.   MRN: [id number]  Chief Complaint  Patient presents with   Bronchitis    HPI Patient is in today for cough and congestion that started about 4 weeks ago.  [facility] and was neg for strep, flu, and COVID.  Mucous is yellow-green in color.  Given ABX and steroid.  No GI sxs. Using her inhaler and helps for about an hour.    ROS      Objective:    BP 104/68   Pulse 80   Temp 98.4 F (36.9 C)   Ht 5\' 7"  (1.702 m)   Wt 180 lb (81.6 kg)   SpO2 98%   BMI 28.19 kg/m    Physical Exam Constitutional:      Appearance: She is well-developed.  HENT:     Head: Normocephalic and atraumatic.     Right Ear: External ear normal.     Left Ear: External ear normal.     Nose: Nose normal.     Mouth/Throat:     Pharynx: Oropharynx is clear. No oropharyngeal exudate.     Comments: Few erythematous spot on uvula Eyes:     Conjunctiva/sclera: Conjunctivae normal.     Pupils: Pupils are equal, round, and reactive to light.  Neck:     Thyroid: No thyromegaly.  Cardiovascular:     Rate and Rhythm: Normal rate and regular rhythm.     Heart sounds: Normal heart sounds.  Pulmonary:     Effort: Pulmonary effort is normal.     Breath sounds: Normal breath sounds. No wheezing.  Musculoskeletal:     Cervical back: Neck supple.  Lymphadenopathy:     Cervical: No cervical adenopathy.  Skin:    General: Skin is warm and dry.  Neurological:     Mental Status: She is alert and oriented to person, place, and time.  Psychiatric:        Mood and Affect: Mood normal.        Behavior: Behavior normal.     No results found for any visits on 11/15/21.      Assessment & Plan:   Problem List Items Addressed This Visit   None Visit Diagnoses     Acute non-recurrent sinusitis, unspecified location    -  Primary   Relevant Medications   azithromycin (ZITHROMAX) 250 MG  tablet      Acute sinusitis-she did get somewhat better after the initial round of antibiotics but now 2 weeks later just cannot get rid of that last bit of congestion and cough.  Call if not better in 1 week.  Discussed that there still could be a viral component.  We are seeing a trend with illnesses that have a lingering cough and congestion.  Meds ordered this encounter  Medications   azithromycin (ZITHROMAX) 250 MG tablet    Sig: 2 Ttabs PO on Day 1, then one a day x 4 days.    Dispense:  6 tablet    Refill:  0    No follow-ups on file.  Beatrice Lecher, MD

## 2021-11-23 ENCOUNTER — Ambulatory Visit: Payer: BC Managed Care – PPO | Admitting: Family Medicine

## 2021-11-24 ENCOUNTER — Other Ambulatory Visit: Payer: Self-pay | Admitting: Family Medicine

## 2021-11-27 ENCOUNTER — Ambulatory Visit: Payer: BC Managed Care – PPO | Admitting: Family Medicine

## 2021-12-10 ENCOUNTER — Other Ambulatory Visit: Payer: Self-pay | Admitting: Family Medicine

## 2021-12-10 DIAGNOSIS — R002 Palpitations: Secondary | ICD-10-CM

## 2022-01-03 ENCOUNTER — Encounter: Payer: Self-pay | Admitting: Family Medicine

## 2022-01-03 ENCOUNTER — Ambulatory Visit: Payer: BC Managed Care – PPO | Admitting: Family Medicine

## 2022-01-03 VITALS — BP 119/55 | HR 78 | Ht 67.0 in | Wt 180.0 lb

## 2022-01-03 DIAGNOSIS — R1011 Right upper quadrant pain: Secondary | ICD-10-CM | POA: Diagnosis not present

## 2022-01-03 DIAGNOSIS — R110 Nausea: Secondary | ICD-10-CM

## 2022-01-03 LAB — COMPLETE METABOLIC PANEL WITH GFR
AG Ratio: 1.7 (calc) (ref 1.0–2.5)
ALT: 18 U/L (ref 6–29)
AST: 17 U/L (ref 10–30)
Albumin: 4.3 g/dL (ref 3.6–5.1)
Alkaline phosphatase (APISO): 46 U/L (ref 31–125)
BUN: 16 mg/dL (ref 7–25)
CO2: 29 mmol/L (ref 20–32)
Calcium: 9.5 mg/dL (ref 8.6–10.2)
Chloride: 104 mmol/L (ref 98–110)
Creat: 0.73 mg/dL (ref 0.50–0.99)
Globulin: 2.5 g/dL (calc) (ref 1.9–3.7)
Glucose, Bld: 91 mg/dL (ref 65–99)
Potassium: 4.9 mmol/L (ref 3.5–5.3)
Sodium: 140 mmol/L (ref 135–146)
Total Bilirubin: 0.4 mg/dL (ref 0.2–1.2)
Total Protein: 6.8 g/dL (ref 6.1–8.1)
eGFR: 107 mL/min/{1.73_m2} (ref >=60)

## 2022-01-03 LAB — CBC WITH DIFFERENTIAL/PLATELET
Absolute Monocytes: 427 cells/uL (ref 200–950)
Basophils Absolute: 49 cells/uL (ref 0–200)
Basophils Relative: 0.9 %
Eosinophils Absolute: 119 cells/uL (ref 15–500)
Eosinophils Relative: 2.2 %
HCT: 39.4 % (ref 35.0–45.0)
Hemoglobin: 13.3 g/dL (ref 11.7–15.5)
Lymphs Abs: 1409 cells/uL (ref 850–3900)
MCH: 32 pg (ref 27.0–33.0)
MCHC: 33.8 g/dL (ref 32.0–36.0)
MCV: 94.9 fL (ref 80.0–100.0)
MPV: 9.6 fL (ref 7.5–12.5)
Monocytes Relative: 7.9 %
Neutro Abs: 3397 cells/uL (ref 1500–7800)
Neutrophils Relative %: 62.9 %
Platelets: 324 10*3/uL (ref 140–400)
RBC: 4.15 10*6/uL (ref 3.80–5.10)
RDW: 11.5 % (ref 11.0–15.0)
Total Lymphocyte: 26.1 %
WBC: 5.4 10*3/uL (ref 3.8–10.8)

## 2022-01-03 NOTE — Progress Notes (Signed)
   Established Patient Office Visit  Subjective   Patient ID: Denise Huff, female    DOB: 23-Jan-1981  Age: 41 y.o. MRN: [id number]  Chief Complaint  Patient presents with   GI Problem    HPI  She is here today because she had intermittent epigastric burning and right upper quadrant pain for at least 6 months.  She said that she is also noticed a change in stools.  Occasionally they will most look like a bright yellow mustardy color.  When she gets the right upper quadrant pain it is often times about a 4 out of 5 but it can be as intense as it is 7.  She notices it more often when she eats greasy fatty foods but even sometimes just a plain salad with no salad dressing cheese etc. can exacerbate symptoms as well.  She has had a couple episodes where she is actually vomited.  Or had nausea and diarrhea after eating something.  She gets very full very quickly and notices a burning sensation in the epigastrium with that.    ROS    Objective:     BP (!) 119/55   Pulse 78   Ht 5\' 7"  (1.702 m)   Wt 180 lb (81.6 kg)   SpO2 95%   BMI 28.19 kg/m    Physical Exam Vitals and nursing note reviewed.  Constitutional:      Appearance: She is well-developed.  HENT:     Head: Normocephalic and atraumatic.  Cardiovascular:     Rate and Rhythm: Normal rate and regular rhythm.     Heart sounds: Normal heart sounds.  Pulmonary:     Effort: Pulmonary effort is normal.     Breath sounds: Normal breath sounds.  Abdominal:     General: Bowel sounds are normal.     Palpations: Abdomen is soft.     Tenderness: There is abdominal tenderness.     Comments: Tenderness in the epigastric area more intense tenderness in the right upper quadrant just along the edge of the ribs.  Skin:    General: Skin is warm and dry.  Neurological:     Mental Status: She is alert and oriented to person, place, and time.  Psychiatric:        Behavior: Behavior normal.      No results found for any visits  on 01/03/22.    The 10-year ASCVD risk score (Arnett DK, et al., 2019) is: 0.4%    Assessment & Plan:   Problem List Items Addressed This Visit   None Visit Diagnoses     RUQ pain    -  Primary   Relevant Orders   2020 Abdomen Complete   COMPLETE METABOLIC PANEL WITH GFR   CBC with Differential/Platelet   Nausea       Relevant Orders   US Abdomen Complete   COMPLETE METABOLIC PANEL WITH GFR   CBC with Differential/Platelet      RUQ pain and nausea -suspect gallbladder disease.  We will get additional labs and ultrasound today for further work-up.  Continue to work on eating a bland diet and staying well-hydrated.   Return if symptoms worsen or fail to improve.    [place], MD

## 2022-01-04 ENCOUNTER — Ambulatory Visit (INDEPENDENT_AMBULATORY_CARE_PROVIDER_SITE_OTHER): Payer: BC Managed Care – PPO

## 2022-01-04 ENCOUNTER — Encounter: Payer: Self-pay | Admitting: Family Medicine

## 2022-01-04 DIAGNOSIS — R1011 Right upper quadrant pain: Secondary | ICD-10-CM

## 2022-01-04 DIAGNOSIS — R110 Nausea: Secondary | ICD-10-CM

## 2022-01-04 NOTE — Progress Notes (Signed)
Sakara, the gallbladder itself actually looked okay they did not see any gallstones or thickening of the gallbladder.  No signs it is increased fat in the liver.  That is not causing your discomfort and we can certainly address that later.  I still think this is probably coming from your gallbladder and I like to refer you to a surgeon to discuss having your gallbladder removed.  If you are okay with that please let me know.  If you are little bit hesitant I can always send you to a GI doctor first and get their opinion.  Just let me know what you would prefer to do.

## 2022-01-04 NOTE — Progress Notes (Signed)
Your lab work is within acceptable range and there are no concerning findings.   ?

## 2022-01-05 ENCOUNTER — Ambulatory Visit: Payer: BC Managed Care – PPO | Admitting: Family Medicine

## 2022-01-23 ENCOUNTER — Encounter: Payer: Self-pay | Admitting: Family Medicine

## 2022-01-23 ENCOUNTER — Ambulatory Visit: Payer: BC Managed Care – PPO | Admitting: Family Medicine

## 2022-01-23 VITALS — BP 117/63 | HR 83 | Ht 67.0 in | Wt 179.0 lb

## 2022-01-23 DIAGNOSIS — Z114 Encounter for screening for human immunodeficiency virus [HIV]: Secondary | ICD-10-CM | POA: Diagnosis not present

## 2022-01-23 DIAGNOSIS — J40 Bronchitis, not specified as acute or chronic: Secondary | ICD-10-CM | POA: Diagnosis not present

## 2022-01-23 MED ORDER — HYDROCODONE BIT-HOMATROP MBR 5-1.5 MG/5ML PO SOLN: 5.0000 mL | Freq: Every evening | ORAL | 0 refills | Status: DC | PRN

## 2022-01-23 NOTE — Progress Notes (Signed)
   Acute Office Visit  Subjective:     Patient ID: EVAH RASHID, female    DOB: 1980/10/18, 41 y.o.   MRN: [id number]  Chief Complaint  Patient presents with   Bronchitis   Pneumonia   Shortness of Breath    HPI Patient is a 41 year old female presenting to clinic for follow up on pneumonia and bronchitis. [facility] and prescribed prednisone and bactrim. She has 3 more days left of antibiotic.  Symptoms started shortly after Thanksgiving after her two kids were sick with a cold. She has had a productive cough with yellow/green mucus, sore throat, and "crackling that sounds like bubble wrap popping in her lungs." After being on antibiotics and prednisone, she has a slight improvement in symptoms, however not feeling completely better. She still experiences cough that keeps her up at night and shortness of breath. She has been having to use her albuterol inhaler 2 to 3 times a week. She checks her oxygen levels and on average they have been 95-96% but she has seen it as low as 92%. She did run a low grade fever over the weekend of 99.6. No fever today, but she has been having flushing in her cheeks.     Review of Systems  Constitutional:  Positive for fever and malaise/fatigue.  HENT:  Positive for sore throat. Negative for congestion, ear pain and sinus pain.   Eyes:  Negative for pain.  Respiratory:  Positive for cough, sputum production, shortness of breath and wheezing.   Cardiovascular:  Negative for chest pain and palpitations.        Objective:    BP 117/63 (BP Location: Left Arm, Patient Position: Sitting, Cuff Size: Normal)   Pulse 83   Ht 5\' 7"  (1.702 m)   Wt 81.2 kg   SpO2 96%   BMI 28.04 kg/m    Physical Exam Constitutional:      Appearance: She is well-developed.  HENT:     Mouth/Throat:     Pharynx: Oropharynx is clear.  Eyes:     Pupils: Pupils are equal, round, and reactive to light.  Cardiovascular:     Rate and  Rhythm: Normal rate and regular rhythm.  Pulmonary:     Effort: Pulmonary effort is normal.     Breath sounds: Normal breath sounds.  Musculoskeletal:     Cervical back: Normal range of motion.  Neurological:     General: No focal deficit present.     Mental Status: She is alert.      No results found for any visits on 01/23/22.      Assessment & Plan:   Problem List Items Addressed This Visit   None Visit Diagnoses     Bronchitis    -  Primary   Relevant Orders   RSV screen (nasopharyngeal)not at [facility]   Screening for HIV without presence of risk factors          Bronchitis Likely viral due to no relief with antibiotics.  RSV swab performed.  Finish the prednisone and antibiotic. If symptoms persist, call the office back and we can consider prescribing azithromycin.  Continue symptom management and albuterol use as needed. Drink plenty of fluids and rest.   No follow-ups on file.  [facility], Student-PA

## 2022-01-23 NOTE — Progress Notes (Signed)
I interviewed and examined the patient personally and agree with the assessment and plan below.

## 2022-01-24 ENCOUNTER — Encounter: Payer: Self-pay | Admitting: Family Medicine

## 2022-01-24 LAB — RSV SCREEN (NASOPHARYNGEAL) NOT AT ARMC
MICRO NUMBER:: 14276241
RESULT:: NOT DETECTED
SPECIMEN QUALITY:: ADEQUATE

## 2022-01-24 NOTE — Progress Notes (Signed)
Denise Huff, RSV negative.  Let us know how you are feeling today if you are feeling any better.

## 2022-02-08 ENCOUNTER — Other Ambulatory Visit: Payer: Self-pay | Admitting: General Surgery

## 2022-02-25 ENCOUNTER — Other Ambulatory Visit: Payer: Self-pay | Admitting: Family Medicine

## 2022-02-25 DIAGNOSIS — D510 Vitamin B12 deficiency anemia due to intrinsic factor deficiency: Secondary | ICD-10-CM

## 2022-03-01 ENCOUNTER — Encounter: Payer: Self-pay | Admitting: Family Medicine

## 2022-03-02 MED ORDER — ALPRAZOLAM 0.25 MG PO TABS: 0.2500 mg | ORAL_TABLET | Freq: Every evening | ORAL | 1 refills | Status: AC | PRN

## 2022-05-25 ENCOUNTER — Ambulatory Visit (INDEPENDENT_AMBULATORY_CARE_PROVIDER_SITE_OTHER): Payer: 59

## 2022-05-25 ENCOUNTER — Ambulatory Visit: Payer: 59 | Admitting: Sports Medicine

## 2022-05-25 DIAGNOSIS — G8929 Other chronic pain: Secondary | ICD-10-CM

## 2022-05-25 DIAGNOSIS — M25562 Pain in left knee: Secondary | ICD-10-CM | POA: Diagnosis not present

## 2022-05-25 DIAGNOSIS — M25561 Pain in right knee: Secondary | ICD-10-CM

## 2022-05-25 MED ORDER — HYDROCODONE-ACETAMINOPHEN 5-325 MG PO TABS: 1.0000 | ORAL_TABLET | Freq: Three times a day (TID) | ORAL | 0 refills | Status: DC | PRN

## 2022-05-25 NOTE — Assessment & Plan Note (Signed)
This is a very pleasant 42 year old female, she has a long history of chronic multifactorial right knee pain, historically has had a medial meniscal tear treated by arthroscopic debridement with Dr. Emelda Fear at [facility], she had a steroid injection, 1 injection of viscosupplementation without significant improvement. She has had physical therapy in the past. Overall she was doing well until recently, went to stand up yesterday, felt a sharp pain and fell. Pain was at the medial and lateral joint lines. Improved a little bit today, no effusion, no tenderness at the medial joint line but she has a minimally positive McMurray's sign with pain but no mechanical symptoms. Due to the acuity of this injury I would like to go conservative at first, we will give her some hydrocodone, baseline x-rays, she will take it easy and return to see me in 2 to 3 weeks, we will proceed with updated MRI if not better.

## 2022-05-25 NOTE — Progress Notes (Signed)
    Procedures performed today:    None.  Independent interpretation of notes and tests performed by another provider:   None.  Brief History, Exam, Impression, and Recommendations:    Chronic pain of right knee This is a very pleasant 42 year old female, she has a long history of chronic multifactorial right knee pain, historically has had a medial meniscal tear treated by arthroscopic debridement with Dr. Emelda Fear at [facility], she had a steroid injection, 1 injection of viscosupplementation without significant improvement. She has had physical therapy in the past. Overall she was doing well until recently, went to stand up yesterday, felt a sharp pain and fell. Pain was at the medial and lateral joint lines. Improved a little bit today, no effusion, no tenderness at the medial joint line but she has a minimally positive McMurray's sign with pain but no mechanical symptoms. Due to the acuity of this injury I would like to go conservative at first, we will give her some hydrocodone, baseline x-rays, she will take it easy and return to see me in 2 to 3 weeks, we will proceed with updated MRI if not better.    ____________________________________________ Ihor Austin. Benjamin Stain, M.D., ABFM., CAQSM., AME. Primary Care and Sports Medicine [facility]  Adjunct Professor of Family Medicine  [employer]  Restaurant manager, fast food

## 2022-06-03 ENCOUNTER — Other Ambulatory Visit: Payer: Self-pay | Admitting: Family Medicine

## 2022-06-03 DIAGNOSIS — R002 Palpitations: Secondary | ICD-10-CM

## 2022-06-15 ENCOUNTER — Ambulatory Visit: Payer: 59 | Admitting: Sports Medicine

## 2022-08-12 ENCOUNTER — Other Ambulatory Visit: Payer: Self-pay | Admitting: Family Medicine

## 2022-08-12 DIAGNOSIS — D510 Vitamin B12 deficiency anemia due to intrinsic factor deficiency: Secondary | ICD-10-CM

## 2022-08-30 ENCOUNTER — Encounter: Payer: Self-pay | Admitting: Family Medicine

## 2022-08-30 DIAGNOSIS — Z0000 Encounter for general adult medical examination without abnormal findings: Secondary | ICD-10-CM

## 2022-09-03 LAB — CBC
HCT: 39.7 % (ref 35.0–45.0)
MCH: 31.6 pg (ref 27.0–33.0)
MCHC: 33 g/dL (ref 32.0–36.0)
MCV: 95.7 fL (ref 80.0–100.0)
MPV: 9.6 fL (ref 7.5–12.5)
Platelets: 304 10*3/uL (ref 140–400)
WBC: 5.8 10*3/uL (ref 3.8–10.8)

## 2022-09-04 ENCOUNTER — Telehealth: Payer: Self-pay | Admitting: Family Medicine

## 2022-09-04 ENCOUNTER — Ambulatory Visit (INDEPENDENT_AMBULATORY_CARE_PROVIDER_SITE_OTHER): Payer: 59 | Admitting: Family Medicine

## 2022-09-04 VITALS — BP 118/63 | HR 83 | Ht 67.0 in | Wt 170.0 lb

## 2022-09-04 DIAGNOSIS — Z23 Encounter for immunization: Secondary | ICD-10-CM | POA: Diagnosis not present

## 2022-09-04 DIAGNOSIS — Z0000 Encounter for general adult medical examination without abnormal findings: Secondary | ICD-10-CM | POA: Diagnosis not present

## 2022-09-04 DIAGNOSIS — J4520 Mild intermittent asthma, uncomplicated: Secondary | ICD-10-CM | POA: Diagnosis not present

## 2022-09-04 LAB — COMPLETE METABOLIC PANEL WITH GFR
AG Ratio: 1.7 (calc) (ref 1.0–2.5)
ALT: 15 U/L (ref 6–29)
AST: 15 U/L (ref 10–30)
Albumin: 4.5 g/dL (ref 3.6–5.1)
Alkaline phosphatase (APISO): 42 U/L (ref 31–125)
BUN: 11 mg/dL (ref 7–25)
CO2: 27 mmol/L (ref 20–32)
Calcium: 9.7 mg/dL (ref 8.6–10.2)
Chloride: 106 mmol/L (ref 98–110)
Creat: 0.84 mg/dL (ref 0.50–0.99)
Globulin: 2.7 g/dL (calc) (ref 1.9–3.7)
Glucose, Bld: 94 mg/dL (ref 65–99)
Potassium: 4.9 mmol/L (ref 3.5–5.3)
Sodium: 142 mmol/L (ref 135–146)
Total Bilirubin: 0.3 mg/dL (ref 0.2–1.2)
Total Protein: 7.2 g/dL (ref 6.1–8.1)
eGFR: 90 mL/min/{1.73_m2} (ref >=60)

## 2022-09-04 LAB — CBC
Hemoglobin: 13.1 g/dL (ref 11.7–15.5)
RBC: 4.15 10*6/uL (ref 3.80–5.10)
RDW: 12 % (ref 11.0–15.0)

## 2022-09-04 LAB — LIPID PANEL W/REFLEX DIRECT LDL
Cholesterol: 166 mg/dL (ref <200)
HDL: 60 mg/dL (ref >=50)
LDL Cholesterol (Calc): 79 mg/dL (calc)
Non-HDL Cholesterol (Calc): 106 mg/dL (calc) (ref <130)
Total CHOL/HDL Ratio: 2.8 (calc) (ref <5.0)
Triglycerides: 175 mg/dL — ABNORMAL HIGH (ref <150)

## 2022-09-04 LAB — VITAMIN B12: Vitamin B-12: 686 pg/mL (ref 200–1100)

## 2022-09-04 LAB — TSH: TSH: 1.57 mIU/L

## 2022-09-04 NOTE — Progress Notes (Signed)
Hi Charlestine, LDL cholesterol looks fantastic!  Your HDL also looks great.  I think this is the highest it has been which is wonderful.  Metabolic panel looks great.  Vitamin B12 is normal.  Thyroid function is great.  Blood count is normal no sign of anemia.

## 2022-09-04 NOTE — Progress Notes (Signed)
Complete physical exam  Patient: Denise Huff   DOB: [birth year]   42 y.o. Female  MRN: [id number]  Subjective:    Chief Complaint  Patient presents with   Annual Exam    She would like to discuss HPV and PNE vaccines. She has Dermatologist appt tomorrow     Denise Huff is a 42 y.o. female who presents today for a complete physical exam. She reports consuming a general diet.  Normally walks 1 mile 4 days per week.   She generally feels well.. She does not have additional problems to discuss today.    Most recent fall risk assessment:    09/04/2022   11:22 AM  Fall Risk   Falls in the past year? 0  Number falls in past yr: 0  Injury with Fall? 0  Risk for fall due to : No Fall Risks  Follow up Falls evaluation completed     Most recent depression screenings:    09/04/2022   11:23 AM 01/23/2022    2:01 PM  PHQ 2/9 Scores  PHQ - 2 Score 1 0        Patient Care Team: Agapito Games, MD as PCP - General (Family Medicine)   Outpatient Medications Prior to Visit  Medication Sig   ALPRAZolam (XANAX) 0.25 MG tablet Take 1 tablet (0.25 mg total) by mouth at bedtime as needed for anxiety or sleep.   atenolol (TENORMIN) 25 MG tablet TAKE 1 TABLET (25 MG TOTAL) BY MOUTH DAILY.   atorvastatin (LIPITOR) 20 MG tablet TAKE 1 TABLET BY MOUTH EVERY DAY   cyanocobalamin (VITAMIN B12) 1000 MCG/ML injection Inject 1 mL (1,000 mcg total) into the muscle every 30 (thirty) days. Needs labs   JUNEL FE 1/20 1-20 MG-MCG tablet Take 1 tablet by mouth daily.   sertraline (ZOLOFT) 100 MG tablet TAKE 1.5 TABLETS BY MOUTH DAILY   [DISCONTINUED] HYDROcodone-acetaminophen (NORCO/VICODIN) 5-325 MG tablet Take 1 tablet by mouth every 8 (eight) hours as needed for moderate pain.   No facility-administered medications prior to visit.    ROS        Objective:     BP 118/63   Pulse 83   Ht 5\' 7"  (1.702 m)   Wt 170 lb (77.1 kg)   SpO2 97%   BMI 26.63 kg/m     Physical Exam Vitals and nursing note reviewed.  Constitutional:      Appearance: Normal appearance. She is well-developed.  HENT:     Head: Normocephalic and atraumatic.     Right Ear: Ear canal and external ear normal.     Left Ear: Tympanic membrane, ear canal and external ear normal.     Ears:     Comments: Right TM with perf    Nose: Nose normal.     Mouth/Throat:     Pharynx: Oropharynx is clear.  Eyes:     Conjunctiva/sclera: Conjunctivae normal.     Pupils: Pupils are equal, round, and reactive to light.  Neck:     Thyroid: No thyromegaly.  Cardiovascular:     Rate and Rhythm: Normal rate and regular rhythm.     Heart sounds: Normal heart sounds.  Pulmonary:     Effort: Pulmonary effort is normal.     Breath sounds: Normal breath sounds. No wheezing.  Abdominal:     General: Bowel sounds are normal. There is no distension.     Palpations: Abdomen is soft.  Musculoskeletal:     Cervical back: Neck  supple.  Lymphadenopathy:     Cervical: No cervical adenopathy.  Skin:    General: Skin is warm and dry.     Coloration: Skin is not pale.  Neurological:     Mental Status: She is alert and oriented to person, place, and time.  Psychiatric:        Behavior: Behavior normal.      No results found for any visits on 09/04/22.     Assessment & Plan:    Routine Health Maintenance and Physical Exam  Immunization History  Administered Date(s) Administered   HPV 9-valent 09/04/2022   Influenza, High Dose Seasonal PF 01/10/2016   Influenza,inj,Quad PF,6+ Mos 10/28/2017, 12/25/2018, 12/18/2020   Influenza,trivalent, recombinat, inj, PF 12/04/2007   PFIZER(Purple Top)SARS-COV-2 Vaccination 04/18/2019, 05/20/2019, 12/21/2019   PNEUMOCOCCAL CONJUGATE-20 09/04/2022   Pfizer Covid-19 Vaccine Bivalent Booster 36yrs & up 12/18/2020   Tdap 09/23/2007, 10/18/2013   Tetanus 08/27/2000    Health Maintenance  Topic Date Due   COVID-19 Vaccine (5 - 2023-24 season)  10/20/2021   HPV VACCINES (2 - Risk 3-dose SCDM series) 10/02/2022   INFLUENZA VACCINE  09/20/2022   DTaP/Tdap/Td (4 - Td or Tdap) 10/19/2023   PAP SMEAR-Modifier  11/24/2024   Hepatitis C Screening  Completed   HIV Screening  Completed   Pneumococcal Vaccine 47-75 Years old  Aged Out    Discussed health benefits of physical activity, and encouraged her to engage in regular exercise appropriate for her age and condition.  Problem List Items Addressed This Visit   None Visit Diagnoses     Wellness examination    -  Primary   Need for HPV vaccination       Relevant Orders   HPV 9-valent vaccine,Recombinat (Completed)   Mild intermittent asthma without complication       Encounter for immunization       Relevant Orders   Pneumococcal conjugate vaccine 20-valent (Completed)       Keep up a regular exercise program and make sure you are eating a healthy diet Try to eat 4 servings of dairy a day, or if you are lactose intolerant take a calcium with vitamin D daily.  Your vaccines are up to date.  Does have a dermatology appointment tomorrow as she does have a dark blue most black lesion on the left forehead near the hairline.  It does not blanch with pressure.  Recommend that she discuss this with her dermatologist tomorrow.  Return in about 5 weeks (around 10/12/2022) for 2nd HPV vaccine. Nani Gasser, MD

## 2022-09-04 NOTE — Telephone Encounter (Signed)
Lease reach out to [facility] for last Pap smear

## 2022-09-06 NOTE — Telephone Encounter (Signed)
Request sent via mychart for pap

## 2022-09-10 ENCOUNTER — Other Ambulatory Visit: Payer: Self-pay | Admitting: Family Medicine

## 2022-09-10 DIAGNOSIS — D510 Vitamin B12 deficiency anemia due to intrinsic factor deficiency: Secondary | ICD-10-CM

## 2022-09-12 NOTE — Telephone Encounter (Signed)
Is refill appropriate? Last labs were within normal range. Thanks in advance.

## 2022-09-14 ENCOUNTER — Telehealth: Payer: Self-pay

## 2022-09-14 ENCOUNTER — Encounter: Payer: Self-pay | Admitting: Family Medicine

## 2022-09-14 NOTE — Telephone Encounter (Signed)
No encounter needed

## 2022-10-08 ENCOUNTER — Other Ambulatory Visit: Payer: Self-pay | Admitting: Family Medicine

## 2022-10-08 DIAGNOSIS — D510 Vitamin B12 deficiency anemia due to intrinsic factor deficiency: Secondary | ICD-10-CM

## 2022-10-08 MED ORDER — CYANOCOBALAMIN 1000 MCG/ML IJ SOLN
INTRAMUSCULAR | 1 refills | Status: DC
Start: 2022-10-08 — End: 2023-03-22

## 2022-10-08 NOTE — Progress Notes (Signed)
Meds ordered this encounter  Medications   cyanocobalamin (VITAMIN B12) 1000 MCG/ML injection    Sig: INJECT 1 ML INTO THE MUSCLE ONCE A WEEK.    Dispense:  12 mL    Refill:  1

## 2022-10-12 ENCOUNTER — Ambulatory Visit (INDEPENDENT_AMBULATORY_CARE_PROVIDER_SITE_OTHER): Payer: 59

## 2022-10-12 VITALS — BP 102/68 | HR 92 | Ht 67.0 in | Wt 170.0 lb

## 2022-10-12 DIAGNOSIS — Z23 Encounter for immunization: Secondary | ICD-10-CM

## 2022-10-12 NOTE — Progress Notes (Unsigned)
HPV vaccince #2 tolerated well. Left deltoid

## 2022-11-05 ENCOUNTER — Encounter (INDEPENDENT_AMBULATORY_CARE_PROVIDER_SITE_OTHER): Payer: 59 | Admitting: Sports Medicine

## 2022-11-05 ENCOUNTER — Ambulatory Visit: Payer: 59 | Admitting: Sports Medicine

## 2022-11-05 ENCOUNTER — Ambulatory Visit: Payer: 59

## 2022-11-05 ENCOUNTER — Encounter: Payer: Self-pay | Admitting: Sports Medicine

## 2022-11-05 DIAGNOSIS — M25511 Pain in right shoulder: Secondary | ICD-10-CM

## 2022-11-05 MED ORDER — NAPROXEN 500 MG PO TABS
500.0000 mg | ORAL_TABLET | Freq: Two times a day (BID) | ORAL | 3 refills | Status: DC
Start: 2022-11-05 — End: 2023-04-24

## 2022-11-05 NOTE — Assessment & Plan Note (Signed)
No trauma, about a week to a week and a half of increasing pain right shoulder localized over the deltoid, does not really have much impingement signs on exam, good rotator cuff strength, positive O'Brien's test, question labral injury. Adding x-rays, home physical therapy, she will do naproxen 500 twice a day consistently for 2 weeks. Return to see me in 4 weeks, MR arthrography if not better.

## 2022-11-05 NOTE — Progress Notes (Signed)
    Procedures performed today:    None.  Independent interpretation of notes and tests performed by another provider:   None.  Brief History, Exam, Impression, and Recommendations:    Acute pain of right shoulder No trauma, about a week to a week and a half of increasing pain right shoulder localized over the deltoid, does not really have much impingement signs on exam, good rotator cuff strength, positive O'Brien's test, question labral injury. Adding x-rays, home physical therapy, she will do naproxen 500 twice a day consistently for 2 weeks. Return to see me in 4 weeks, MR arthrography if not better.    ____________________________________________ Ihor Austin. Benjamin Stain, M.D., ABFM., CAQSM., AME. Primary Care and Sports Medicine [facility]  Adjunct Professor of Family Medicine  [employer] [facility] [employer] [facility] [employer]  Restaurant manager, fast food

## 2022-11-16 NOTE — Telephone Encounter (Signed)

## 2022-11-22 ENCOUNTER — Telehealth: Payer: Self-pay | Admitting: Sports Medicine

## 2022-11-22 NOTE — Telephone Encounter (Signed)
Patient is requesting an update on an imaging PA for her shoulder. Please advise.

## 2022-11-26 NOTE — Telephone Encounter (Signed)
Task completed. Auth obtained for MRI of the shoulder. Imaging department has been updated. The patient may call the imaging dept directly at [phone] to schedule an appointment. Thanks,

## 2022-11-27 ENCOUNTER — Other Ambulatory Visit: Payer: Self-pay | Admitting: Sports Medicine

## 2022-11-27 DIAGNOSIS — M25511 Pain in right shoulder: Secondary | ICD-10-CM

## 2022-11-27 NOTE — Telephone Encounter (Signed)
Patient came in today complaining of severe shoulder pain and not getting her MRI arthrogram done, it was ordered on 11/16/2022, authed only yesterday, she is now requesting that we get it done somewhere else because MRI is down today, and I am not here Monday or Tuesday next week.  That would leave either [facility] imaging or Dr. Joseph Berkshire at [facility].  Please call both locations and see who can get her in for an arthrogram sooner.

## 2022-11-30 ENCOUNTER — Ambulatory Visit
Admission: RE | Admit: 2022-11-30 | Discharge: 2022-11-30 | Disposition: A | Discharge status: Home / Self Care | Payer: 59 | Source: Ambulatory Visit | Attending: Sports Medicine | Admitting: Sports Medicine

## 2022-11-30 ENCOUNTER — Ambulatory Visit: Payer: 59 | Admitting: Sports Medicine

## 2022-11-30 DIAGNOSIS — M25511 Pain in right shoulder: Secondary | ICD-10-CM

## 2022-11-30 MED ORDER — IOPAMIDOL (ISOVUE-M 200) INJECTION 41%
10.0000 mL | Freq: Once | INTRAMUSCULAR | Status: AC
  Administered 2022-11-30: 10 mL via INTRA_ARTICULAR

## 2022-11-30 NOTE — Telephone Encounter (Signed)
Thank you everyone for all of your work on this case.

## 2022-11-30 NOTE — Telephone Encounter (Signed)
Per insurance - patient may go to any outpatient imaging location. No update is required. Patient is all set to have the study at [facility] imaging as planned.

## 2022-12-02 ENCOUNTER — Other Ambulatory Visit: Payer: Self-pay | Admitting: Family Medicine

## 2022-12-02 DIAGNOSIS — R002 Palpitations: Secondary | ICD-10-CM

## 2022-12-04 ENCOUNTER — Encounter: Payer: Self-pay | Admitting: Sports Medicine

## 2022-12-25 ENCOUNTER — Ambulatory Visit: Payer: 59

## 2023-01-25 ENCOUNTER — Telehealth: Payer: Self-pay | Admitting: Family Medicine

## 2023-01-25 NOTE — Telephone Encounter (Signed)
Denise Huff   from [facility] needing to know if the forms where received  through fax she would like a call back regarding this matter Denise Huff stated the the forms need to be filled out this is urgent

## 2023-01-30 ENCOUNTER — Telehealth: Payer: Self-pay | Admitting: Family Medicine

## 2023-01-30 NOTE — Telephone Encounter (Signed)
I don't know anything about this nor have I seen any forms/paperwork for her.   I called pt and asked her

## 2023-01-30 NOTE — Telephone Encounter (Signed)
Copied from CRM [id number]. Topic: General - Other >> Jan 30, 2023  3:14 PM Gaetano Hawthorne wrote: Reason for CRM: Samantha from [facility] is calling to confirm whether the office has received the faxes on the patient - they need the information/forms faxed back by the end of business day. Please call her back at [phone].

## 2023-02-01 ENCOUNTER — Telehealth: Payer: Self-pay

## 2023-02-01 NOTE — Telephone Encounter (Signed)
Roxine gives verbal authorization for Dr Linford Arnold to fill out insurance form.

## 2023-02-01 NOTE — Telephone Encounter (Signed)
Called patient at Dr. Linford Arnold request.  Would like to know if she gives authorization to complete paperwork for  [employer].   Called patient and left voice to return our call.

## 2023-02-01 NOTE — Telephone Encounter (Signed)
[place] called back to check on status of insurance form.

## 2023-02-01 NOTE — Telephone Encounter (Signed)
Form completed. Placed in [place] B box

## 2023-02-04 ENCOUNTER — Telehealth: Payer: Self-pay | Admitting: Family Medicine

## 2023-02-04 NOTE — Telephone Encounter (Signed)
Copied from CRM [id number]. Topic: General - Other >> Feb 04, 2023  1:56 PM Gaetano Hawthorne wrote: Reason for CRM: [facility] have not received the forms that were faxed over - can you re-fax the forms to [phone] and if there's any questions, please call [phone].   Epic chart mentions that the forms have been completed and placed in Tonya B's box. They have called multiple times in the last week to check on the status of these forms and state that they need them by the end of business day today.

## 2023-02-05 NOTE — Telephone Encounter (Signed)
This has been taken care of.

## 2023-02-05 NOTE — Telephone Encounter (Signed)
Attempted call to patient. Left a detailed voice mail message on patient home # that forms had been faxed.

## 2023-02-05 NOTE — Telephone Encounter (Signed)
Forms completed and scanned into pt's chart

## 2023-02-05 NOTE — Telephone Encounter (Signed)
Forms faxed to [employer]  Confirmation received and scanned into pt's chart.

## 2023-02-06 NOTE — Telephone Encounter (Signed)
Left patient detailed voice mail message on home #

## 2023-02-06 NOTE — Telephone Encounter (Signed)
Called and left a detailed voice mail message - =kph

## 2023-02-07 NOTE — Telephone Encounter (Signed)
Pt's form was completed and faxed to her insurance company. Confirmation received and sent to scanning.

## 2023-02-22 ENCOUNTER — Encounter: Payer: Self-pay | Admitting: Family Medicine

## 2023-02-22 ENCOUNTER — Ambulatory Visit (INDEPENDENT_AMBULATORY_CARE_PROVIDER_SITE_OTHER): Payer: 59 | Admitting: Family Medicine

## 2023-02-22 DIAGNOSIS — Z23 Encounter for immunization: Secondary | ICD-10-CM | POA: Diagnosis not present

## 2023-02-22 NOTE — Progress Notes (Signed)
 Agree with documentation as above.   Nani Gasser, MD

## 2023-02-22 NOTE — Progress Notes (Signed)
 HPI    Pt comes in today for her 3rd HPV vaccine and covid and flu               Assessment and Plan:  Pt given Covid in RD and HPV and flu in the LD. Pt tolerated well. No f/u on file.

## 2023-03-18 ENCOUNTER — Ambulatory Visit: Payer: 59

## 2023-03-18 ENCOUNTER — Ambulatory Visit: Payer: 59 | Admitting: Sports Medicine

## 2023-03-18 DIAGNOSIS — L738 Other specified follicular disorders: Secondary | ICD-10-CM | POA: Diagnosis not present

## 2023-03-18 DIAGNOSIS — R252 Cramp and spasm: Secondary | ICD-10-CM | POA: Diagnosis not present

## 2023-03-18 DIAGNOSIS — E538 Deficiency of other specified B group vitamins: Secondary | ICD-10-CM

## 2023-03-18 DIAGNOSIS — L989 Disorder of the skin and subcutaneous tissue, unspecified: Secondary | ICD-10-CM | POA: Insufficient documentation

## 2023-03-18 MED ORDER — DOXYCYCLINE HYCLATE 100 MG PO TABS: 100.0000 mg | ORAL_TABLET | Freq: Two times a day (BID) | ORAL | 0 refills | Status: AC

## 2023-03-18 MED ORDER — MAGNESIUM OXIDE 400 MG PO TABS: 800.0000 mg | ORAL_TABLET | Freq: Every day | ORAL | 3 refills | Status: DC

## 2023-03-18 NOTE — Assessment & Plan Note (Signed)
Small focus of inflammation/infection left mandible angle. This is complicated with picking. She will avoid contact with it and we will do a course of doxycycline, if persistence after a couple of weeks we will consider shave biopsy with hyfrecation.

## 2023-03-18 NOTE — Progress Notes (Signed)
    Procedures performed today:    None.  Independent interpretation of notes and tests performed by another provider:   None.  Brief History, Exam, Impression, and Recommendations:    Folliculitis barbae Small focus of inflammation/infection left mandible angle. This is complicated with picking. She will avoid contact with it and we will do a course of doxycycline, if persistence after a couple of weeks we will consider shave biopsy with hyfrecation.  Hand cramp, left Few months of painful cramping left hand, thumb is forced into abduction and pinky into abduction. Exam is normal today. Negative manual Trousseau test. Patient was reassured. Adding x-rays, labs including electrolytes, calcium, B12. Home exercises given. Magnesium oxide for a few weeks. If persistent discomfort we will consider referral to neurology for consider nerve conduction and EMG as well as additional testing for potential of myotonic dystrophy, though I think this would be extremely rare/unlikely.    ____________________________________________ Ihor Austin. Benjamin Stain, M.D., ABFM., CAQSM., AME. Primary Care and Sports Medicine [facility]  Adjunct Professor of Family Medicine  [employer]  Restaurant manager, fast food

## 2023-03-18 NOTE — Assessment & Plan Note (Addendum)
Few months of painful cramping left hand, thumb is forced into abduction and pinky into abduction. Exam is normal today. Negative manual Trousseau test. Patient was reassured. Adding x-rays, labs including electrolytes, calcium, B12. Home exercises given. Magnesium oxide for a few weeks. If persistent discomfort we will consider referral to neurology for consider nerve conduction and EMG as well as additional testing for potential of myotonic dystrophy, though I think this would be extremely rare/unlikely.

## 2023-03-19 ENCOUNTER — Encounter: Payer: Self-pay | Admitting: Sports Medicine

## 2023-03-19 LAB — BASIC METABOLIC PANEL
BUN/Creatinine Ratio: 16 (ref 9–23)
BUN: 12 mg/dL (ref 6–24)
CO2: 20 mmol/L (ref 20–29)
Calcium: 8.9 mg/dL (ref 8.7–10.2)
Chloride: 105 mmol/L (ref 96–106)
Creatinine, Ser: 0.76 mg/dL (ref 0.57–1.00)
Glucose: 93 mg/dL (ref 70–99)
Potassium: 4.4 mmol/L (ref 3.5–5.2)
Sodium: 140 mmol/L (ref 134–144)
eGFR: 101 mL/min/{1.73_m2} (ref >59)

## 2023-03-19 LAB — CK: Total CK: 83 U/L (ref 32–182)

## 2023-03-19 LAB — SEDIMENTATION RATE: Sed Rate: 2 mm/h (ref 0–32)

## 2023-03-19 LAB — VITAMIN B12: Vitamin B-12: 351 pg/mL (ref 232–1245)

## 2023-03-22 ENCOUNTER — Other Ambulatory Visit: Payer: Self-pay | Admitting: Family Medicine

## 2023-03-22 DIAGNOSIS — D510 Vitamin B12 deficiency anemia due to intrinsic factor deficiency: Secondary | ICD-10-CM

## 2023-04-01 ENCOUNTER — Ambulatory Visit (INDEPENDENT_AMBULATORY_CARE_PROVIDER_SITE_OTHER): Payer: 59 | Admitting: Sports Medicine

## 2023-04-01 DIAGNOSIS — L989 Disorder of the skin and subcutaneous tissue, unspecified: Secondary | ICD-10-CM | POA: Diagnosis not present

## 2023-04-01 NOTE — Progress Notes (Signed)
    Procedures performed today:    Procedure: Shave biopsy of 0.6 cm left jaw skin lesion Risks, benefits, and alternatives explained and consent obtained. Time out conducted. Surface prepped with alcohol. 1cc lidocaine  with epinephine infiltrated in a field block. Adequate anesthesia ensured. Area prepped and draped in a sterile fashion. Excision performed with: Using a DermaBlade I made a shave into the deep dermis removing the lesion entirely, I then used the Hyfrecator to achieve hemostasis. Hemostasis achieved. Pt stable.  Independent interpretation of notes and tests performed by another provider:   None.  Brief History, Exam, Impression, and Recommendations:    Skin lesion of left jaw Denise Huff returns, she continues to have a papule left jaw. It did not respond to doxycycline  as well as avoidance of picking. Today we did a shave biopsy with hyfrecation, we will await Derm path. I do suspect this is a benign lesion. Return to see me as needed.    ____________________________________________ [name]. Sandy Crumb, M.D., ABFM., CAQSM., AME. Primary Care and Sports Medicine [facility]  Adjunct Professor of [facility] Medicine  [employer]  Restaurant manager, fast food

## 2023-04-01 NOTE — Assessment & Plan Note (Signed)
 Denise Huff returns, she continues to have a papule left jaw. It did not respond to doxycycline  as well as avoidance of picking. Today we did a shave biopsy with hyfrecation, we will await Derm path. I do suspect this is a benign lesion. Return to see me as needed.

## 2023-04-04 LAB — DERMATOLOGY PATHOLOGY

## 2023-04-16 ENCOUNTER — Ambulatory Visit: Payer: 59

## 2023-04-16 ENCOUNTER — Ambulatory Visit: Payer: 59 | Admitting: Sports Medicine

## 2023-04-16 DIAGNOSIS — M25572 Pain in left ankle and joints of left foot: Secondary | ICD-10-CM

## 2023-04-16 DIAGNOSIS — R252 Cramp and spasm: Secondary | ICD-10-CM

## 2023-04-16 DIAGNOSIS — S93332A Other subluxation of left foot, initial encounter: Secondary | ICD-10-CM

## 2023-04-16 NOTE — Progress Notes (Signed)
    Procedures performed today:    None.  Independent interpretation of notes and tests performed by another provider:   None.  Brief History, Exam, Impression, and Recommendations:    Hand cramp, left Auria returns, she is a pleasant 43 year old female, she has had several months of painful cramping left hand, typically her thumb is forced into abduction and the pinky into abduction. Her exam has been normal both times have seen her for this. She has a negative manual Trousseau test. X-rays were normal, labs including electrolytes, calcium, B12 were normal. We gave her some home exercises and magnesium oxide. She has not improved. Symptoms are not worse at night. No numbness or tingling. Unclear etiology though I did reassure her that intermittent cramping could be a normal phenomenon. I would like neurology to weigh in potentially for nerve conduction/EMG as well as additional testing potentially for myotonic type dystrophy which I think would be extremely rare and unlikely. We stopped magnesium due to loose stools. Follow-up with me as needed.  Subluxation of peroneal tendon of left foot Increasing discomfort left lateral ankle. Occasional popping sensations behind the malleolus. On exam she does have some tenderness at the peroneals proximal to the lateral malleolus. We discussed the anatomy and pathophysiology of peroneal subluxation. She has a lace up brace at home that she will start wearing, adding x-rays, peroneal tendon conditioning, return to see me in 4 to 6 weeks, MRI if not better.    ____________________________________________ Ihor Austin. Benjamin Stain, M.D., ABFM., CAQSM., AME. Primary Care and Sports Medicine  [facility]  Adjunct Professor of Family Medicine  [employer]  Restaurant manager, fast food

## 2023-04-16 NOTE — Assessment & Plan Note (Signed)
 Increasing discomfort left lateral ankle. Occasional popping sensations behind the malleolus. On exam she does have some tenderness at the peroneals proximal to the lateral malleolus. We discussed the anatomy and pathophysiology of peroneal subluxation. She has a lace up brace at home that she will start wearing, adding x-rays, peroneal tendon conditioning, return to see me in 4 to 6 weeks, MRI if not better.

## 2023-04-16 NOTE — Assessment & Plan Note (Signed)
 Denise Huff returns, she is a pleasant 43 year old female, she has had several months of painful cramping left hand, typically her thumb is forced into abduction and the pinky into abduction. Her exam has been normal both times have seen her for this. She has a negative manual Trousseau test. X-rays were normal, labs including electrolytes, calcium, B12 were normal. We gave her some home exercises and magnesium oxide. She has not improved. Symptoms are not worse at night. No numbness or tingling. Unclear etiology though I did reassure her that intermittent cramping could be a normal phenomenon. I would like neurology to weigh in potentially for nerve conduction/EMG as well as additional testing potentially for myotonic type dystrophy which I think would be extremely rare and unlikely. We stopped magnesium due to loose stools. Follow-up with me as needed.

## 2023-04-19 ENCOUNTER — Encounter (INDEPENDENT_AMBULATORY_CARE_PROVIDER_SITE_OTHER): Payer: Self-pay | Admitting: Sports Medicine

## 2023-04-19 DIAGNOSIS — S93332A Other subluxation of left foot, initial encounter: Secondary | ICD-10-CM

## 2023-04-24 ENCOUNTER — Ambulatory Visit: Admitting: Neurology

## 2023-04-24 ENCOUNTER — Encounter: Payer: Self-pay | Admitting: Neurology

## 2023-04-24 ENCOUNTER — Encounter: Payer: 59 | Admitting: Neurology

## 2023-04-24 ENCOUNTER — Other Ambulatory Visit: Payer: Self-pay

## 2023-04-24 ENCOUNTER — Ambulatory Visit (INDEPENDENT_AMBULATORY_CARE_PROVIDER_SITE_OTHER): Admitting: Neurology

## 2023-04-24 VITALS — BP 115/70 | HR 87 | Ht 67.0 in | Wt 185.0 lb

## 2023-04-24 DIAGNOSIS — R252 Cramp and spasm: Secondary | ICD-10-CM | POA: Diagnosis not present

## 2023-04-24 DIAGNOSIS — R202 Paresthesia of skin: Secondary | ICD-10-CM

## 2023-04-24 DIAGNOSIS — G5602 Carpal tunnel syndrome, left upper limb: Secondary | ICD-10-CM

## 2023-04-24 NOTE — Procedures (Signed)
  [facility]  [address] [place], [address]  [place], [place] [address] Tel: [phone] Fax: [phone] Test Date:  04/24/2023  Patient: Denise Huff DOB: Jul 03, 1980 Physician: Nita Sickle, DO  Sex: Female Height: 5\' 7"  Ref Phys: Rodney Langton, MD  ID#: [id number]   Technician:    History: This is a 43 year old female referred for evaluation of left hand cramps.  NCV & EMG Findings: Extensive electrodiagnostic testing of the left upper extremity shows:  Left mixed palmar sensory responses show mildly prolonged latency.  Left median and ulnar sensory responses are within normal limits.   The left median and ulnar motor responses are within normal limits.   There is no evidence of active or chronic motor axonal loss changes affecting any of the tested muscles.  Motor unit configuration and recruitment pattern is within normal limits.  Impression: Left median neuropathy at or distal to the wrist, consistent with a clinical diagnosis of carpal tunnel syndrome.  Overall, these findings are very mild in degree electrically. There is no evidence of a left cervical radiculopathy, ulnar neuropathy, or myopathy.   ___________________________ Nita Sickle, DO    Nerve Conduction Studies   Stim Site NR Peak (ms) Norm Peak (ms) O-P Amp (V) Norm O-P Amp  Left Median Anti Sensory (2nd Digit)  32 C  Wrist    2.8 <3.4 47.5 >20  Left Ulnar Anti Sensory (5th Digit)  32 C  Wrist    2.4 <3.1 28.4 >12     Stim Site NR Onset (ms) Norm Onset (ms) O-P Amp (mV) Norm O-P Amp Site1 Site2 Delta-0 (ms) Dist (cm) Vel (m/s) Norm Vel (m/s)  Left Median Motor (Abd Poll Brev)  32 C  Wrist    2.9 <3.9 9.6 >6 Elbow Wrist 4.6 27.0 59 >50  Elbow    7.5  9.6         Left Ulnar Motor (Abd Dig Minimi)  32 C  Wrist    2.2 <3.1 9.6 >7 B Elbow Wrist 3.4 23.0 68 >50  B Elbow    5.6  8.3  A Elbow B Elbow 1.5 10.0 67 >50  A Elbow    7.1  8.1            Stim Site NR Peak (ms) Norm Peak  (ms) P-T Amp (V) Site1 Site2 Delta-P (ms) Norm Delta (ms)  Left Median/Ulnar Palm Comparison (Wrist - 8cm)  32 C  Median Palm    1.8 <2.2 52.5 Median Palm Ulnar Palm *0.4   Ulnar Palm    1.4 <2.2 19.9       Electromyography   Side Muscle Ins.Act Fibs Fasc Recrt Amp Dur Poly Activation Comment  Left 1stDorInt Nml Nml Nml Nml Nml Nml Nml Nml N/A  Left Abd Poll Brev Nml Nml Nml Nml Nml Nml Nml Nml N/A  Left PronatorTeres Nml Nml Nml Nml Nml Nml Nml Nml N/A  Left Biceps Nml Nml Nml Nml Nml Nml Nml Nml N/A  Left Triceps Nml Nml Nml Nml Nml Nml Nml Nml N/A  Left Deltoid Nml Nml Nml Nml Nml Nml Nml Nml N/A  Left Cervical Parasp Low Nml Nml Nml Nml Nml Nml Nml Nml N/A      Waveforms:

## 2023-04-24 NOTE — Progress Notes (Signed)
 [facility] Clinic Note - Initial Visit   Date: 04/24/2023   Denise Huff MRN: [id number] DOB: [birth year]   Dear Dr. Benjamin Stain:  Thank you for your kind referral of Denise Huff for consultation of left hand cramping. Although her history is well known to you, please allow [place] to reiterate it for the purpose of our medical record. The patient was accompanied to the clinic by self.  Denise Huff is a 43 y.o. right-handed female with hyperlipidemia and depression/anxiety presenting for evaluation of left hand cramps.   IMPRESSION/PLAN: Left hand cramps seems benign and positional.  Neurological exam is normal.  NCS/EMG of the left arm performed today shows very mild left carpal tunnel syndrome, no evidence of cervical radiculopathy or myopathy.  Specifically, no myotonic discharges were seen.  Prior labs show normal electrolytes, vitamin B12, and TSH. Reassured patient that I believe hand cramps are benign.  I encouraged the patient to stay well-hydrated and try stretching the hand.  If symptoms become more frequent or longer, muscle relaxer can be offered.   Return to clinic as needed  ------------------------------------------------------------- History of present illness: Starting in August 2024, she was holding a clipboard and develop sudden painful cramping over the left thumb adduction, which lasts about 2 minutes. She also has noticed that her left fifth finger extends when this happens.  She has constant sharp pain over the back of her left hand.  Symptoms are worse when she is holding her phone or book.  It occurs about 3-4 times per week.  She has to manually extend her thumb to get relief.  Her left 5th finger returns to normal within a few minutes.  She denies left hand weakness or neck pain.  No family history of neuromuscular disease.   She is a [employer].  Nonsmoker.  Occasional alcohol use.    Out-side paper records,  electronic medical record, and images have been reviewed where available and summarized as:  Lab Results  Component Value Date   HGBA1C 5.2 04/03/2018   Lab Results  Component Value Date   VITAMINB12 351 03/18/2023   Lab Results  Component Value Date   TSH 1.57 09/03/2022   Lab Results  Component Value Date   ESRSEDRATE 2 03/18/2023    Past Medical History:  Diagnosis Date   Anxiety    Depression    Hyperlipidemia    Pernicious anemia     Past Surgical History:  Procedure Laterality Date   APPENDECTOMY     CESAREAN SECTION     KNEE SURGERY     TONSILLECTOMY     TYMPANOSTOMY TUBE PLACEMENT       Medications:  Outpatient Encounter Medications as of 04/24/2023  Medication Sig   ALPRAZolam (XANAX) 0.25 MG tablet Take 1 tablet (0.25 mg total) by mouth at bedtime as needed for anxiety or sleep.   atenolol (TENORMIN) 25 MG tablet TAKE 1 TABLET (25 MG TOTAL) BY MOUTH DAILY.   atorvastatin (LIPITOR) 20 MG tablet TAKE 1 TABLET BY MOUTH EVERY DAY   cyanocobalamin (VITAMIN B12) 1000 MCG/ML injection INJECT 1 ML INTO THE MUSCLE ONCE A WEEK.   JUNEL FE 1/20 1-20 MG-MCG tablet Take 1 tablet by mouth daily.   sertraline (ZOLOFT) 100 MG tablet TAKE 1.5 TABLETS BY MOUTH DAILY (Patient taking differently: Take one tablet daily)   [DISCONTINUED] naproxen (NAPROSYN) 500 MG tablet Take 1 tablet (500 mg total) by mouth 2 (two) times daily with a meal. (Patient not  taking: Reported on 04/24/2023)   No facility-administered encounter medications on file as of 04/24/2023.    Allergies:  Allergies  Allergen Reactions   Dog Epithelium    Grass Pollen(K-O-R-T-Swt Vern)    Metoprolol Cough   Milk-Related Compounds    Molds & Smuts    Other Other (See Comments)    Latex band-aids with adhesive causes welts. Okay with fabric/paper band-aids.     Family History: Family History  Problem Relation Age of Onset   Skin cancer Mother    Depression Mother    COPD Mother    CAD Father 29        triple bypass    Hypertension Father    Skin cancer Father    Diabetes Father    Polycystic ovary syndrome Sister    Depression Sister    Depression Brother    Breast cancer Maternal Aunt     Social History: Social History   Tobacco Use   Smoking status: Never   Smokeless tobacco: Never  Vaping Use   Vaping status: Never Used  Substance Use Topics   Alcohol use: Yes    Alcohol/week: 2.0 standard drinks of alcohol    Types: 2 Glasses of wine per week    Comment: 1-2 glasses of wine a week   Drug use: Never   Social History   Social History Narrative   3 12 oz diet cokes a day. Exercise once a week 45 min.       Are you right handed or left handed? Right Handed   Are you currently employed ? 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 No   Who lives with you? Husband and Kids   What type of home do you live in: 1 story or 2 story?         Vital Signs:  BP 115/70   Pulse 87   Ht 5\' 7"  (1.702 m)   Wt 185 lb (83.9 kg)   SpO2 97%   BMI 28.98 kg/m   Neurological Exam: MENTAL STATUS including orientation to time, place, person, recent and remote memory, attention span and concentration, language, and fund of knowledge is normal.  Speech is not dysarthric.  CRANIAL NERVES: II:  No visual field defects.     III-IV-VI: Pupils equal round and reactive to light.  Normal conjugate, extra-ocular eye movements in all directions of gaze.  No nystagmus.  No ptosis.   V:  Normal facial sensation.    VII:  Normal facial symmetry and movements.   VIII:  Normal hearing and vestibular function.   IX-X:  Normal palatal movement.   XI:  Normal shoulder shrug and head rotation.   XII:  Normal tongue strength and range of motion, no deviation or fasciculation.  MOTOR:  No atrophy, fasciculations or abnormal movements.  No pronator drift.  No percussion myotonia or grip myotonia.  Upper Extremity:  Right  Left  Deltoid  5/5   5/5    Biceps  5/5   5/5   Triceps  5/5   5/5   Wrist extensors  5/5   5/5   Wrist flexors  5/5   5/5   Finger extensors  5/5   5/5   Finger flexors  5/5   5/5   Dorsal interossei  5/5   5/5   Abductor pollicis  5/5   5/5   Tone (Ashworth scale)  0  0  Lower Extremity:  Right  Left  Hip flexors  5/5   5/5   Knee flexors  5/5   5/5   Knee extensors  5/5   5/5   Dorsiflexors  5/5   5/5   Plantarflexors  5/5   5/5   Toe extensors  5/5   5/5   Toe flexors  5/5   5/5   Tone (Ashworth scale)  0  0   MSRs:                                           Right        Left brachioradialis 2+  2+  biceps 2+  2+  triceps 2+  2+  patellar 2+  2+  ankle jerk 2+  2+  Hoffman no  no  plantar response down  down   SENSORY:  Normal and symmetric perception of light touch, pinprick, vibration, and proprioception.    COORDINATION/GAIT: Normal finger-to- nose-finger.  Intact rapid alternating movements bilaterally.   Gait mildly antalgic due to left ankle pain, slow, unassisted.  Gait is improved when she wears her boot.   Thank you for allowing me to participate in patient's care.  If I can answer any additional questions, I would be pleased to do so.    Sincerely,    Amarien Carne K. Allena Katz, DO

## 2023-05-07 ENCOUNTER — Encounter: Payer: Self-pay | Admitting: Sports Medicine

## 2023-05-07 ENCOUNTER — Ambulatory Visit: Admitting: Sports Medicine

## 2023-05-07 DIAGNOSIS — S93332A Other subluxation of left foot, initial encounter: Secondary | ICD-10-CM

## 2023-05-07 MED ORDER — AMBULATORY NON FORMULARY MEDICATION: 0 refills | Status: DC

## 2023-05-07 NOTE — Assessment & Plan Note (Signed)
 Denise Huff returns, she is a pleasant 43 year old female, she has discomfort left lateral ankle, popping sensations behind the malleolus, she did also have some tenderness at the peroneals proximal to the lateral malleolus. Lace up brace was insufficient, we then transition into a boot which was also insufficient, she continued to have popping sensations. Today we placed a short leg cast, she will get a rolling knee scooter and return to see me in about 4 weeks. Injection +/- MRI if not better.

## 2023-05-07 NOTE — Progress Notes (Signed)
    Procedures performed today:    Today I placed a short leg cast  Independent interpretation of notes and tests performed by another provider:   None.  Brief History, Exam, Impression, and Recommendations:    Subluxation of peroneal tendon of left foot Denise Huff returns, she is a pleasant 43 year old female, she has discomfort left lateral ankle, popping sensations behind the malleolus, she did also have some tenderness at the peroneals proximal to the lateral malleolus. Lace up brace was insufficient, we then transition into a boot which was also insufficient, she continued to have popping sensations. Today we placed a short leg cast, she will get a rolling knee scooter and return to see me in about 4 weeks. Injection +/- MRI if not better.    ____________________________________________ Ihor Austin. Benjamin Stain, M.D., ABFM., CAQSM., AME. Primary Care and Sports Medicine [facility]  Adjunct Professor of Family Medicine  [employer] [facility]  Restaurant manager, fast food

## 2023-05-14 ENCOUNTER — Ambulatory Visit: Payer: 59 | Admitting: Sports Medicine

## 2023-05-20 NOTE — Addendum Note (Signed)
 Addended by: Monica Becton on: 05/20/2023 01:10 PM   Modules accepted: Orders

## 2023-05-20 NOTE — Telephone Encounter (Signed)

## 2023-05-26 ENCOUNTER — Ambulatory Visit

## 2023-05-26 DIAGNOSIS — S93332A Other subluxation of left foot, initial encounter: Secondary | ICD-10-CM

## 2023-05-30 ENCOUNTER — Telehealth: Payer: Self-pay | Admitting: *Deleted

## 2023-05-30 NOTE — Telephone Encounter (Signed)
 Called [facility] to have pt's MRI results changed to STAT since she has an upcoming appt 4/15. Called pt to let her know about this and she verbalized understanding. Nothing further needed.

## 2023-05-30 NOTE — Telephone Encounter (Signed)
 Copied from CRM [id number]. Topic: Clinical - Lab/Test Results >> May 30, 2023  9:29 AM Louie Boston wrote: Reason for CRM: Patient called requesting MRI results from Sundays MRI appointment. Patient wants to know if results will be available before her appointment on Tuesday. Please advise.

## 2023-05-31 ENCOUNTER — Encounter: Payer: Self-pay | Admitting: Sports Medicine

## 2023-06-04 ENCOUNTER — Other Ambulatory Visit (INDEPENDENT_AMBULATORY_CARE_PROVIDER_SITE_OTHER)

## 2023-06-04 ENCOUNTER — Ambulatory Visit: Admitting: Sports Medicine

## 2023-06-04 ENCOUNTER — Encounter: Payer: Self-pay | Admitting: Sports Medicine

## 2023-06-04 DIAGNOSIS — S93332A Other subluxation of left foot, initial encounter: Secondary | ICD-10-CM | POA: Diagnosis not present

## 2023-06-04 DIAGNOSIS — F418 Other specified anxiety disorders: Secondary | ICD-10-CM | POA: Diagnosis not present

## 2023-06-04 MED ORDER — TRIAMCINOLONE ACETONIDE 40 MG/ML IJ SUSP
40.0000 mg | Freq: Once | INTRAMUSCULAR | Status: AC
Start: 2023-06-04 — End: 2023-06-04
  Administered 2023-06-04: 40 mg via INTRAMUSCULAR

## 2023-06-04 MED ORDER — SERTRALINE HCL 100 MG PO TABS: 150.0000 mg | ORAL_TABLET | Freq: Every day | ORAL | 1 refills | Status: DC

## 2023-06-04 NOTE — Assessment & Plan Note (Signed)
 Denise Huff returns, she is a 43 year old female, chronic left lateral ankle discomfort with popping sensations behind the malleolus. After failure of the lace up brace we transition into a boot which was also insufficient, we placed her in a short leg cast for forced immobilization with a rolling knee scooter, she did not tolerate the cast and went to the ED to have it removed. She is back in the boot now. Persistent discomfort, MRI did show some tendinosis and degenerative fraying of the peroneus brevis but no overt tears noted on MRI. Due to failure of all of the above conservative treatment we will do a peroneal sheath injection today, she will continue the boot for 4 more days, restart her home conditioning and return to see me in 6 weeks. If insufficient improvement we will refer to orthopedic foot and ankle surgery.

## 2023-06-04 NOTE — Assessment & Plan Note (Signed)
 Patient very tearful in the exam room. Acute on chronic anxiety and depression with her ankle, increasing Zoloft 150 mg from 100 mg for approximately 6 to 8 weeks, if things go really well we can consider keeping it at 150 mg daily.

## 2023-06-04 NOTE — Progress Notes (Signed)
    Procedures performed today:    Procedure: Real-time Ultrasound Guided injection of the left peroneal tendon sheath Device: Samsung HS60  Verbal informed consent obtained.  Time-out conducted.  Noted no overlying erythema, induration, or other signs of local infection.  Skin prepped in a sterile fashion.  Local anesthesia: Topical Ethyl chloride.  With sterile technique and under real time ultrasound guidance: Noted mild sheath effusion, 1 cc Kenalog 40, 1 cc lidocaine, 1 cc bupivacaine injected easily Completed without difficulty  Advised to call if fevers/chills, erythema, induration, drainage, or persistent bleeding.  Images permanently stored and available for review in PACS.  Impression: Technically successful ultrasound guided injection.  Independent interpretation of notes and tests performed by another provider:   None.  Brief History, Exam, Impression, and Recommendations:    Subluxation of peroneal tendon of left foot Denise Huff returns, she is a 43 year old female, chronic left lateral ankle discomfort with popping sensations behind the malleolus. After failure of the lace up brace we transition into a boot which was also insufficient, we placed her in a short leg cast for forced immobilization with a rolling knee scooter, she did not tolerate the cast and went to the ED to have it removed. She is back in the boot now. Persistent discomfort, MRI did show some tendinosis and degenerative fraying of the peroneus brevis but no overt tears noted on MRI. Due to failure of all of the above conservative treatment we will do a peroneal sheath injection today, she will continue the boot for 4 more days, restart her home conditioning and return to see me in 6 weeks. If insufficient improvement we will refer to orthopedic foot and ankle surgery.  Depression with anxiety Patient very tearful in the exam room. Acute on chronic anxiety and depression with her ankle, increasing Zoloft  150 mg from 100 mg for approximately 6 to 8 weeks, if things go really well we can consider keeping it at 150 mg daily.    ____________________________________________ [name]. Sandy Crumb, M.D., ABFM., CAQSM., AME. Primary Care and Sports Medicine [facility]  Adjunct Professor of [facility] Medicine  [employer]  Restaurant manager, fast food

## 2023-06-04 NOTE — Addendum Note (Signed)
 Addended by: OLIVA-AVELLANEDA, Wiley Magan L on: 06/04/2023 04:07 PM   Modules accepted: Orders

## 2023-06-06 ENCOUNTER — Other Ambulatory Visit: Payer: Self-pay | Admitting: Family Medicine

## 2023-06-06 DIAGNOSIS — R002 Palpitations: Secondary | ICD-10-CM

## 2023-06-09 ENCOUNTER — Encounter: Payer: Self-pay | Admitting: Sports Medicine

## 2023-06-09 DIAGNOSIS — S93332A Other subluxation of left foot, initial encounter: Secondary | ICD-10-CM

## 2023-06-12 ENCOUNTER — Telehealth: Payer: Self-pay

## 2023-06-12 NOTE — Telephone Encounter (Signed)
 Copied from CRM [id number]. Topic: Referral - Status >> Jun 12, 2023  9:03 AM Shelby Dessert H wrote: Reason for CRM: Patient called and she wants to know the status of her referral she is afraid that the referral will get lost in transaction because that happened to her previously, patients callback number is [phone]. Patient states she is a Runner, broadcasting/film/video and if she doesn't not answer you can leave a message.

## 2023-06-12 NOTE — Telephone Encounter (Signed)
 Is this the correct location to send for primary care [facility] referrals?

## 2023-06-12 NOTE — Telephone Encounter (Signed)
 Referral sent and spoke with patient confirming referral as well.

## 2023-07-16 ENCOUNTER — Ambulatory Visit: Admitting: Sports Medicine

## 2023-08-02 ENCOUNTER — Other Ambulatory Visit: Payer: Self-pay | Admitting: Family Medicine

## 2023-08-02 DIAGNOSIS — Z1231 Encounter for screening mammogram for malignant neoplasm of breast: Secondary | ICD-10-CM

## 2023-08-13 ENCOUNTER — Encounter: Payer: Self-pay | Admitting: Family Medicine

## 2023-08-14 ENCOUNTER — Ambulatory Visit

## 2023-08-14 ENCOUNTER — Other Ambulatory Visit: Payer: Self-pay | Admitting: *Deleted

## 2023-08-14 DIAGNOSIS — Z0000 Encounter for general adult medical examination without abnormal findings: Secondary | ICD-10-CM

## 2023-08-14 NOTE — Telephone Encounter (Signed)
 Do you mind ordering these Tonya?

## 2023-08-16 ENCOUNTER — Ambulatory Visit: Payer: Self-pay | Admitting: Family Medicine

## 2023-08-16 LAB — CBC
Hematocrit: 41.8 % (ref 34.0–46.6)
Hemoglobin: 13.4 g/dL (ref 11.1–15.9)
MCH: 31.9 pg (ref 26.6–33.0)
MCHC: 32.1 g/dL (ref 31.5–35.7)
MCV: 100 fL — ABNORMAL HIGH (ref 79–97)
Platelets: 285 10*3/uL (ref 150–450)
RBC: 4.2 x10E6/uL (ref 3.77–5.28)
RDW: 11.9 % (ref 11.7–15.4)
WBC: 8.1 10*3/uL (ref 3.4–10.8)

## 2023-08-16 LAB — CMP14+EGFR
ALT: 16 IU/L (ref 0–32)
AST: 13 IU/L (ref 0–40)
Albumin: 4.4 g/dL (ref 3.9–4.9)
Alkaline Phosphatase: 57 IU/L (ref 44–121)
BUN/Creatinine Ratio: 13 (ref 9–23)
BUN: 11 mg/dL (ref 6–24)
Bilirubin Total: 0.5 mg/dL (ref 0.0–1.2)
CO2: 21 mmol/L (ref 20–29)
Calcium: 9.5 mg/dL (ref 8.7–10.2)
Chloride: 101 mmol/L (ref 96–106)
Creatinine, Ser: 0.84 mg/dL (ref 0.57–1.00)
Globulin, Total: 2.2 g/dL (ref 1.5–4.5)
Glucose: 94 mg/dL (ref 70–99)
Potassium: 4.5 mmol/L (ref 3.5–5.2)
Sodium: 139 mmol/L (ref 134–144)
Total Protein: 6.6 g/dL (ref 6.0–8.5)
eGFR: 89 mL/min/{1.73_m2} (ref >59)

## 2023-08-16 LAB — VITAMIN B12: Vitamin B-12: 351 pg/mL (ref 232–1245)

## 2023-08-16 LAB — LIPID PANEL WITH LDL/HDL RATIO
Cholesterol, Total: 204 mg/dL — ABNORMAL HIGH (ref 100–199)
HDL: 62 mg/dL (ref >39)
LDL Chol Calc (NIH): 104 mg/dL — ABNORMAL HIGH (ref 0–99)
LDL/HDL Ratio: 1.7 ratio (ref 0.0–3.2)
Triglycerides: 225 mg/dL — ABNORMAL HIGH (ref 0–149)
VLDL Cholesterol Cal: 38 mg/dL (ref 5–40)

## 2023-08-16 LAB — TSH: TSH: 1.3 u[IU]/mL (ref 0.450–4.500)

## 2023-08-16 NOTE — Progress Notes (Signed)
 Hi Denise Huff, triglycerides were up this time.  LDL was a little borderline.  Continue to work on healthy diet more vegetables and lean proteins less carbs and processed foods.  Also working on exercise for goal of 20 minutes 5 days/week.  Hemoglobin looks good no sign of anemia.  B12 is normal.  Metabolic panel looks good.  Thyroid looks great.  Call lab and see if we can add a folic acid level.

## 2023-08-21 LAB — FOLATE: Folate: 13.3 ng/mL (ref >3.0)

## 2023-08-21 LAB — SPECIMEN STATUS REPORT

## 2023-08-21 NOTE — Progress Notes (Signed)
 Hagen, folate looks good.  No deficiency.

## 2023-09-05 ENCOUNTER — Encounter: Payer: Self-pay | Admitting: Family Medicine

## 2023-09-05 ENCOUNTER — Ambulatory Visit (INDEPENDENT_AMBULATORY_CARE_PROVIDER_SITE_OTHER): Payer: 59 | Admitting: Family Medicine

## 2023-09-05 VITALS — BP 112/61 | HR 76 | Ht 67.0 in | Wt 193.1 lb

## 2023-09-05 DIAGNOSIS — F418 Other specified anxiety disorders: Secondary | ICD-10-CM | POA: Diagnosis not present

## 2023-09-05 DIAGNOSIS — R21 Rash and other nonspecific skin eruption: Secondary | ICD-10-CM | POA: Diagnosis not present

## 2023-09-05 DIAGNOSIS — Z23 Encounter for immunization: Secondary | ICD-10-CM

## 2023-09-05 MED ORDER — SERTRALINE HCL 100 MG PO TABS: 100.0000 mg | ORAL_TABLET | Freq: Every day | ORAL | 3 refills | Status: AC

## 2023-09-05 MED ORDER — CLOTRIMAZOLE-BETAMETHASONE 1-0.05 % EX CREA: 1.0000 | TOPICAL_CREAM | Freq: Two times a day (BID) | CUTANEOUS | 0 refills | Status: AC

## 2023-09-05 NOTE — Progress Notes (Signed)
 Complete physical exam  Patient: Denise Huff   DOB: October 29, 1980   43 y.o. Female  MRN: [id number]  Subjective:    Chief Complaint  Patient presents with   Annual Exam    Pt has places on skin that she would like checked.    Denise Huff is a 43 y.o. female who presents today for a complete physical exam. She reports consuming a general diet. The patient does not participate in regular exercise at present. She goes to PT twice a week for her knee. She generally feels well. She reports sleeping well. She does not have additional problems to discuss today.   She did note she had started some fish oil for her elevated triglycerides recently.  She has gained a little bit of weight over this last year after having had surgery.  She was not weightbearing for quite a while she is engaged in formal PT twice a week.   Most recent fall risk assessment:    09/05/2023   10:46 AM  Fall Risk   Falls in the past year? 0  Number falls in past yr: 0  Injury with Fall? 0  Risk for fall due to : No Fall Risks  Follow up Falls evaluation completed     Most recent depression screenings:    09/05/2023   10:46 AM 09/04/2022   11:23 AM  PHQ 2/9 Scores  PHQ - 2 Score 0 1  PHQ- 9 Score 0         Patient Care Team: Alvan Denise BIRCH, MD as PCP - General (Family Medicine) Clayton Quale, MD as Referring Physician (Obstetrics and Gynecology) Patel, Donika K, DO as Consulting Physician (Neurology)   Outpatient Medications Prior to Visit  Medication Sig   ALPRAZolam  (XANAX ) 0.25 MG tablet Take 1 tablet (0.25 mg total) by mouth at bedtime as needed for anxiety or sleep.   atenolol  (TENORMIN ) 25 MG tablet TAKE 1 TABLET (25 MG TOTAL) BY MOUTH DAILY.   atorvastatin  (LIPITOR) 20 MG tablet TAKE 1 TABLET BY MOUTH EVERY DAY   cyanocobalamin  (VITAMIN B12) 1000 MCG/ML injection INJECT 1 ML INTO THE MUSCLE ONCE A WEEK.   fluticasone-salmeterol (ADVAIR) 100-50 MCG/ACT AEPB Inhale 1 puff  into the lungs 2 (two) times daily.   JUNEL FE 1/20 1-20 MG-MCG tablet Take 1 tablet by mouth daily.   levocetirizine (XYZAL) 5 MG tablet Take 5 mg by mouth.   Omega-3 Fatty Acids (FISH OIL) 1000 MG CAPS Take 1 capsule by mouth daily.   [DISCONTINUED] sertraline  (ZOLOFT ) 100 MG tablet Take 1.5 tablets (150 mg total) by mouth daily.   [DISCONTINUED] AMBULATORY NON FORMULARY MEDICATION Rolling Knee Scooter, please take Rx to medical supply store.   No facility-administered medications prior to visit.    ROS        Objective:     BP 112/61   Pulse 76   Ht 5' 7 (1.702 m)   Wt 193 lb 1.9 oz (87.6 kg)   SpO2 97%   BMI 30.25 kg/m    Physical Exam Vitals and nursing note reviewed.  Constitutional:      Appearance: Normal appearance.  HENT:     Head: Normocephalic and atraumatic.  Eyes:     Conjunctiva/sclera: Conjunctivae normal.  Cardiovascular:     Rate and Rhythm: Normal rate and regular rhythm.  Pulmonary:     Effort: Pulmonary effort is normal.     Breath sounds: Normal breath sounds.  Skin:    General: Skin  is warm and dry.     Comments: Scattered seborrheic keratoses on her back.  And some solar lentigo on her legs  Neurological:     Mental Status: She is alert.  Psychiatric:        Mood and Affect: Mood normal.      No results found for any visits on 09/05/23.     Assessment & Plan:    Routine Health Maintenance and Physical Exam  Immunization History  Administered Date(s) Administered   HPV 9-valent 09/04/2022, 10/12/2022, 02/22/2023   Influenza, High Dose Seasonal PF 01/10/2016   Influenza, Seasonal, Injecte, Preservative Fre 02/22/2023   Influenza,inj,Quad PF,6+ Mos 10/28/2017, 12/25/2018, 12/18/2020   Influenza,trivalent, recombinat, inj, PF 12/04/2007   PFIZER(Purple Top)SARS-COV-2 Vaccination 04/18/2019, 05/20/2019, 12/21/2019   PNEUMOCOCCAL CONJUGATE-20 09/04/2022   Pfizer Covid-19 Vaccine Bivalent Booster 87yrs & up 12/18/2020    Pfizer(Comirnaty)Fall Seasonal Vaccine 12 years and older 02/22/2023   Tdap 09/23/2007, 10/18/2013, 09/05/2023   Tetanus 08/27/2000    Health Maintenance  Topic Date Due   Hepatitis B Vaccines (1 of 3 - 19+ 3-dose series) Never done   COVID-19 Vaccine (6 - Pfizer risk 2024-25 season) 08/22/2023   INFLUENZA VACCINE  09/20/2023   Cervical Cancer Screening (HPV/Pap Cotest)  11/24/2024   DTaP/Tdap/Td (5 - Td or Tdap) 09/04/2033   Pneumococcal Vaccine 62-70 Years old  Completed   HPV VACCINES  Completed   Hepatitis C Screening  Completed   HIV Screening  Completed   Meningococcal B Vaccine  Aged Out    Discussed health benefits of physical activity, and encouraged her to engage in regular exercise appropriate for her age and condition.  Problem List Items Addressed This Visit       Other   Depression with anxiety   Relevant Medications   sertraline  (ZOLOFT ) 100 MG tablet   Other Visit Diagnoses       Encounter for immunization    -  Primary   Relevant Orders   Tdap vaccine greater than or equal to 7yo IM (Completed)     Rash       Relevant Medications   clotrimazole -betamethasone  (LOTRISONE ) cream      Did review labs together today.    Vaccines are up to date.  She does need a refill on her sertraline  and she is actually only taking 1 tab now so we will correct the prescription.  Also she does have a dry scaly itchy rash around her right ankle she tried a barrier ointment which helped temporarily she has tried wearing a sock to absorb extra moisture and nothing seems to really make it go completely away and stay away we will do a trial of Lotrisone  cream.  Return in about 6 months (around 03/07/2024).     Denise Byars, MD

## 2023-10-22 ENCOUNTER — Encounter: Payer: Self-pay | Admitting: Sports Medicine

## 2023-11-18 ENCOUNTER — Other Ambulatory Visit: Payer: Self-pay | Admitting: Family Medicine

## 2023-12-01 ENCOUNTER — Other Ambulatory Visit: Payer: Self-pay | Admitting: Family Medicine

## 2023-12-01 ENCOUNTER — Encounter: Payer: Self-pay | Admitting: Family Medicine

## 2023-12-01 DIAGNOSIS — M25511 Pain in right shoulder: Secondary | ICD-10-CM

## 2023-12-01 DIAGNOSIS — R002 Palpitations: Secondary | ICD-10-CM

## 2023-12-02 NOTE — Telephone Encounter (Signed)
 Ok to offer ortho martinique or Dr. Redell Robes at [facility].P

## 2023-12-02 NOTE — Addendum Note (Signed)
 Addended by: ANTONIETTE VERMELL CROME on: 12/02/2023 01:04 PM   Modules accepted: Orders

## 2024-03-10 ENCOUNTER — Ambulatory Visit: Admitting: Family Medicine

## 2024-03-10 ENCOUNTER — Encounter: Payer: Self-pay | Admitting: Family Medicine

## 2024-03-10 VITALS — BP 123/57 | HR 84 | Ht 67.0 in | Wt 196.4 lb

## 2024-03-10 DIAGNOSIS — E538 Deficiency of other specified B group vitamins: Secondary | ICD-10-CM

## 2024-03-10 DIAGNOSIS — D510 Vitamin B12 deficiency anemia due to intrinsic factor deficiency: Secondary | ICD-10-CM | POA: Diagnosis not present

## 2024-03-10 DIAGNOSIS — R5383 Other fatigue: Secondary | ICD-10-CM

## 2024-03-10 DIAGNOSIS — Z0184 Encounter for antibody response examination: Secondary | ICD-10-CM | POA: Diagnosis not present

## 2024-03-10 DIAGNOSIS — R4189 Other symptoms and signs involving cognitive functions and awareness: Secondary | ICD-10-CM

## 2024-03-10 DIAGNOSIS — M7918 Myalgia, other site: Secondary | ICD-10-CM

## 2024-03-10 NOTE — Progress Notes (Addendum)
 "  Established Patient Office Visit  Patient ID: Denise Huff, female    DOB: 1980/03/15  Age: 44 y.o. MRN: [id number] PCP: Alvan Dorothyann BIRCH, MD  Chief Complaint  Patient presents with   Medical Management of Chronic Issues    Subjective:     HPI  Discussed the use of AI scribe software for clinical note transcription with the patient, who gave verbal consent to proceed.  History of Present Illness Denise Huff is a 44 year old female who presents with bone pain and sleep disturbances.  Bone pain - Persistent pain localized to the collarbone, neck, back, upper arm, and shins - Pain is constant and described as originating in the bones rather than the joints - Shin pain characterized as similar to 'shin splints' - Symptoms worsen with seasonal changes, particularly during transition into fall - Limited benefit from previous cortisone injections in the shoulders  Sleep disturbances - Difficulty falling asleep and staying asleep - Non-restorative sleep, not feeling refreshed upon waking - Symptoms have persisted for two years - Initial improvement with B12 supplementation, but ongoing issues - No snoring, nocturnal gasping, or restlessness at night  Sensory disturbances and muscle cramps - Numbness and tingling in the legs and hands, described as a sensation of 'falling asleep' - History of muscle cramps unresponsive to magnesium  supplementation and neurological evaluation  Cognitive impairment and fatigue - Difficulty recalling information that should be easily remembered - Attribution of memory issues and fatigue to aging - Ongoing fatigue  Dietary intolerance post-cholecystectomy - Unable to tolerate dairy products following gallbladder removal due to discomfort - Tolerates lactose-free alternatives without issues     ROS    Objective:     BP (!) 123/57   Pulse 84   Ht 5' 7 (1.702 m)   Wt 196 lb 6.4 oz (89.1 kg)   SpO2 99%   BMI 30.76  kg/m    Physical Exam Vitals reviewed.  Constitutional:      Appearance: Normal appearance.  HENT:     Head: Normocephalic.  Pulmonary:     Effort: Pulmonary effort is normal.  Neurological:     Mental Status: She is alert and oriented to person, place, and time.  Psychiatric:        Mood and Affect: Mood normal.        Behavior: Behavior normal.      Results for orders placed or performed in visit on 03/10/24  Measles/Mumps/Rubella Immunity  Result Value Ref Range   Rubella Antibodies, IGG 2.76 Immune >0.99 index   RUBEOLA AB, IGG 19.7 Immune >16.4 AU/mL   MUMPS ABS, IGG 12.5 Immune >10.9 AU/mL  Vitamin D  (25 hydroxy)  Result Value Ref Range   Vit D, 25-Hydroxy 17.3 (L) 30.0 - 100.0 ng/mL  Vitamin B1  Result Value Ref Range   Thiamine 113.8 66.5 - 200.0 nmol/L  Vitamin B6  Result Value Ref Range   Vitamin B6 5.0 3.4 - 65.2 ug/L  Magnesium   Result Value Ref Range   Magnesium  2.0 1.6 - 2.3 mg/dL  Zinc   Result Value Ref Range   Zinc  70 44 - 115 ug/dL  Phosphorus  Result Value Ref Range   Phosphorus 2.4 (L) 3.0 - 4.3 mg/dL  CK (Creatine Kinase)  Result Value Ref Range   Total CK 76 32 - 182 U/L  Sedimentation rate  Result Value Ref Range   Sed Rate 5 0 - 32 mm/hr  C-reactive protein  Result Value Ref Range  CRP 1 0 - 10 mg/L  CMP14+EGFR  Result Value Ref Range   Glucose 94 70 - 99 mg/dL   BUN 15 6 - 24 mg/dL   Creatinine, Ser 9.24 0.57 - 1.00 mg/dL   eGFR 897 >40 fO/fpw/8.26   BUN/Creatinine Ratio 20 9 - 23   Sodium 139 134 - 144 mmol/L   Potassium 3.9 3.5 - 5.2 mmol/L   Chloride 103 96 - 106 mmol/L   CO2 21 20 - 29 mmol/L   Calcium  8.7 8.7 - 10.2 mg/dL   Total Protein 6.5 6.0 - 8.5 g/dL   Albumin 4.5 3.9 - 4.9 g/dL   Globulin, Total 2.0 1.5 - 4.5 g/dL   Bilirubin Total 0.4 0.0 - 1.2 mg/dL   Alkaline Phosphatase 60 41 - 116 IU/L   AST 21 0 - 40 IU/L   ALT 27 0 - 32 IU/L      The 10-year ASCVD risk score (Arnett DK, et al., 2019) is: 0.5%     Assessment & Plan:   Problem List Items Addressed This Visit       Other   Pernicious anemia   Relevant Orders   Measles/Mumps/Rubella Immunity (Completed)   Vitamin D  (25 hydroxy) (Completed)   Vitamin B1 (Completed)   Vitamin B6 (Completed)   Magnesium  (Completed)   Zinc  (Completed)   Phosphorus (Completed)   CK (Creatine Kinase) (Completed)   Sedimentation rate (Completed)   C-reactive protein (Completed)   CMP14+EGFR (Completed)   Fatigue - Primary   Relevant Orders   Measles/Mumps/Rubella Immunity (Completed)   Vitamin D  (25 hydroxy) (Completed)   Vitamin B1 (Completed)   Vitamin B6 (Completed)   Magnesium  (Completed)   Zinc  (Completed)   Phosphorus (Completed)   CK (Creatine Kinase) (Completed)   Sedimentation rate (Completed)   C-reactive protein (Completed)   CMP14+EGFR (Completed)   Other Visit Diagnoses       Immunity status testing       Relevant Orders   Measles/Mumps/Rubella Immunity (Completed)     B12 deficiency       Relevant Orders   Measles/Mumps/Rubella Immunity (Completed)   Vitamin D  (25 hydroxy) (Completed)   Vitamin B1 (Completed)   Vitamin B6 (Completed)   Magnesium  (Completed)   Zinc  (Completed)   Phosphorus (Completed)   CK (Creatine Kinase) (Completed)   Sedimentation rate (Completed)   C-reactive protein (Completed)   CMP14+EGFR (Completed)     Myofascial pain       Relevant Orders   Measles/Mumps/Rubella Immunity (Completed)   Vitamin D  (25 hydroxy) (Completed)   Vitamin B1 (Completed)   Vitamin B6 (Completed)   Magnesium  (Completed)   Zinc  (Completed)   Phosphorus (Completed)   CK (Creatine Kinase) (Completed)   Sedimentation rate (Completed)   C-reactive protein (Completed)   CMP14+EGFR (Completed)     Brain fog       Relevant Orders   Measles/Mumps/Rubella Immunity (Completed)   Vitamin D  (25 hydroxy) (Completed)   Vitamin B1 (Completed)   Vitamin B6 (Completed)   Magnesium  (Completed)   Zinc  (Completed)    Phosphorus (Completed)   CK (Creatine Kinase) (Completed)   Sedimentation rate (Completed)   C-reactive protein (Completed)   CMP14+EGFR (Completed)       Assessment and Plan Assessment & Plan Chronic widespread pain and fatigue (rule out fibromyalgia) Symptoms suggestive of fibromyalgia, including pain, sleep disturbances, cognitive issues, and muscle cramps. Differential diagnosis includes fibromyalgia, myofascial pain syndrome, vitamin D  deficiency, and electrolyte imbalances. Fibromyalgia is a diagnosis of exclusion. -  Ordered blood work: vitamin D , CK, magnesium , phosphorus, metabolic panel. - Reassess symptoms after correcting deficiencies or imbalances. - Consider fibromyalgia diagnosis if other conditions are ruled out and symptoms persist. - Discussed potential treatment options if fibromyalgia is confirmed, including medications and graded exercise therapy. - Diagnotic Criteria for Fibromyalgia - Part 1 (WPI) score of 9, Part 2a score of 5 having fatigue, waking unrefreshed, and cognitive sxs, and Part 2b score of 2.  2a+ 2b = 7.  She screens positive for fibromylagia.    Fatigue- Epworth Spleepiness Scale (ESS) of 13 = excessively sleepy.    Immunity status testing (measles titer) Testing for immunity status due to potential exposure risk and incomplete vaccination records. Importance of confirming immunity status discussed. - Ordered measles titer to assess immunity status.    Return in about 3 months (around 06/08/2024) for medical managment of chronic issues.    Dorothyann Byars, MD [facility] at [facility]   "

## 2024-03-12 ENCOUNTER — Ambulatory Visit: Payer: Self-pay | Admitting: Family Medicine

## 2024-03-12 NOTE — Progress Notes (Signed)
 Hi Francyne, your vitamin D  is low.  Not sure if you are currently on a supplement but make sure that you are taking either 25 or 50 mcg daily and then we can recheck your level in a couple of months.  Your metabolic panel and inflammatory markers are normal which is great.  Muscle enzyme is also normal no sign of excess breakdown.  Magnesium  looks great.  Phosphorus is a little bit low.  This most likely is because your D is low so once that corrects the phosphorus will likely correct as well.  You can also try to eat some foods that are rich in phosphorus such as salmon, beans, lentils, and cheese.  You are immune to measles mumps and rubella which is great.  B1 and B6 and zinc  are still pending.

## 2024-03-14 LAB — VITAMIN D 25 HYDROXY (VIT D DEFICIENCY, FRACTURES): Vit D, 25-Hydroxy: 17.3 ng/mL — ABNORMAL LOW (ref 30.0–100.0)

## 2024-03-14 LAB — CK: Total CK: 76 U/L (ref 32–182)

## 2024-03-14 LAB — MEASLES/MUMPS/RUBELLA IMMUNITY
MUMPS ABS, IGG: 12.5 [AU]/ml
RUBEOLA AB, IGG: 19.7 [AU]/ml
Rubella Antibodies, IGG: 2.76 {index}

## 2024-03-14 LAB — CMP14+EGFR
ALT: 27 [IU]/L (ref 0–32)
AST: 21 [IU]/L (ref 0–40)
Albumin: 4.5 g/dL (ref 3.9–4.9)
Alkaline Phosphatase: 60 [IU]/L (ref 41–116)
BUN/Creatinine Ratio: 20 (ref 9–23)
BUN: 15 mg/dL (ref 6–24)
Bilirubin Total: 0.4 mg/dL (ref 0.0–1.2)
CO2: 21 mmol/L (ref 20–29)
Calcium: 8.7 mg/dL (ref 8.7–10.2)
Chloride: 103 mmol/L (ref 96–106)
Creatinine, Ser: 0.75 mg/dL (ref 0.57–1.00)
Globulin, Total: 2 g/dL (ref 1.5–4.5)
Glucose: 94 mg/dL (ref 70–99)
Potassium: 3.9 mmol/L (ref 3.5–5.2)
Sodium: 139 mmol/L (ref 134–144)
Total Protein: 6.5 g/dL (ref 6.0–8.5)
eGFR: 102 mL/min/{1.73_m2}

## 2024-03-14 LAB — ZINC: Zinc: 70 ug/dL (ref 44–115)

## 2024-03-14 LAB — VITAMIN B1: Thiamine: 113.8 nmol/L (ref 66.5–200.0)

## 2024-03-14 LAB — PHOSPHORUS: Phosphorus: 2.4 mg/dL — ABNORMAL LOW (ref 3.0–4.3)

## 2024-03-14 LAB — VITAMIN B6: Vitamin B6: 5 ug/L (ref 3.4–65.2)

## 2024-03-14 LAB — SEDIMENTATION RATE: Sed Rate: 5 mm/h (ref 0–32)

## 2024-03-14 LAB — C-REACTIVE PROTEIN: CRP: 1 mg/L (ref 0–10)

## 2024-03-14 LAB — MAGNESIUM: Magnesium: 2 mg/dL (ref 1.6–2.3)

## 2024-03-16 ENCOUNTER — Telehealth: Payer: Self-pay | Admitting: Family Medicine

## 2024-03-16 DIAGNOSIS — M7918 Myalgia, other site: Secondary | ICD-10-CM

## 2024-03-16 MED ORDER — CYCLOBENZAPRINE HCL 5 MG PO TABS: 5.0000 mg | ORAL_TABLET | Freq: Every day | ORAL | 1 refills | Status: AC

## 2024-03-16 NOTE — Telephone Encounter (Signed)
 Meds ordered this encounter  Medications   cyclobenzaprine  (FLEXERIL ) 5 MG tablet    Sig: Take 1 tablet (5 mg total) by mouth at bedtime.    Dispense:  90 tablet    Refill:  1

## 2024-06-09 ENCOUNTER — Ambulatory Visit: Admitting: Family Medicine
